# Patient Record
Sex: Female | Born: 1990 | Race: White | Hispanic: No | Marital: Married | Smoking: Never smoker
Health system: Southern US, Community
[De-identification: ages and names within clinical notes are randomized; demographics above are authoritative.]

## PROBLEM LIST (undated history)

## (undated) ENCOUNTER — Inpatient Hospital Stay: Payer: Self-pay

## (undated) DIAGNOSIS — Z789 Other specified health status: Secondary | ICD-10-CM

---

## 2012-12-05 ENCOUNTER — Emergency Department: Payer: Self-pay | Admitting: Emergency Medicine

## 2012-12-05 LAB — CBC
HGB: 12.7 g/dL (ref 12.0–16.0)
MCH: 31.7 pg (ref 26.0–34.0)
MCHC: 33.4 g/dL (ref 32.0–36.0)
Platelet: 271 10*3/uL (ref 150–440)
RBC: 4.02 10*6/uL (ref 3.80–5.20)
RDW: 12 % (ref 11.5–14.5)

## 2012-12-05 LAB — URINALYSIS, COMPLETE
Bacteria: NONE SEEN
Bilirubin,UR: NEGATIVE
Glucose,UR: NEGATIVE mg/dL (ref 0–75)
Leukocyte Esterase: NEGATIVE
RBC,UR: 1 /HPF (ref 0–5)
Squamous Epithelial: 17

## 2012-12-05 LAB — COMPREHENSIVE METABOLIC PANEL
Alkaline Phosphatase: 77 U/L — ABNORMAL LOW (ref 82–169)
BUN: 12 mg/dL (ref 7–18)
Creatinine: 0.82 mg/dL (ref 0.60–1.30)
EGFR (Non-African Amer.): >60
Glucose: 103 mg/dL — ABNORMAL HIGH (ref 65–99)
Osmolality: 279 (ref 275–301)
SGOT(AST): 49 U/L — ABNORMAL HIGH (ref 0–26)
Sodium: 140 mmol/L (ref 136–145)
Total Protein: 8.5 g/dL (ref 6.4–8.6)

## 2012-12-06 LAB — DIFFERENTIAL
Basophil #: 0 10*3/uL (ref 0.0–0.1)
Basophil %: 0.2 %
Eosinophil #: 0 10*3/uL (ref 0.0–0.7)
Eosinophil %: 0.1 %
Lymphocyte %: 4.2 %
Monocyte %: 2.8 %
Neutrophil #: 18.6 10*3/uL — ABNORMAL HIGH (ref 1.4–6.5)

## 2013-08-29 ENCOUNTER — Emergency Department: Payer: Self-pay | Admitting: Emergency Medicine

## 2014-11-10 IMAGING — US ABDOMEN ULTRASOUND LIMITED
1 series · 14 of 25 positions shown · non-contrast
Comparison: none

REASON FOR EXAM: epigastric pain
COMMENTS:   Body Site: GB and Fossa, CBD, Head of Pancreas

[Series 1: abdomen ultrasound limited · 0.20mm/px · 14 of 26 slices shown]
[im 1/26]
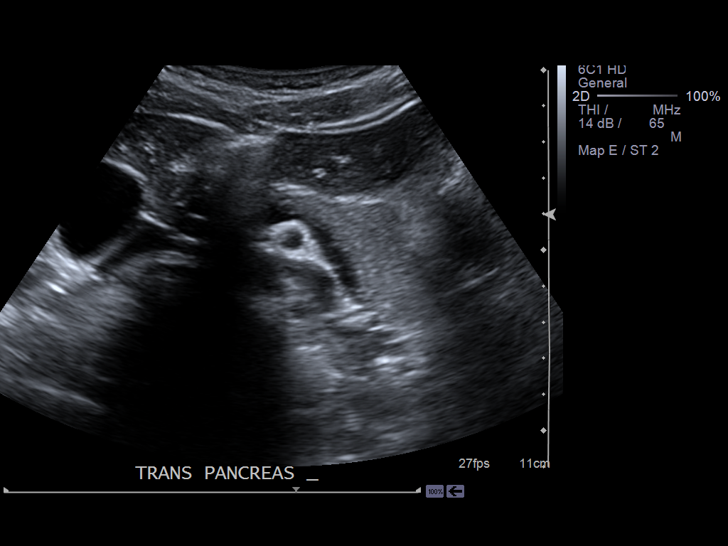
[im 3/26]
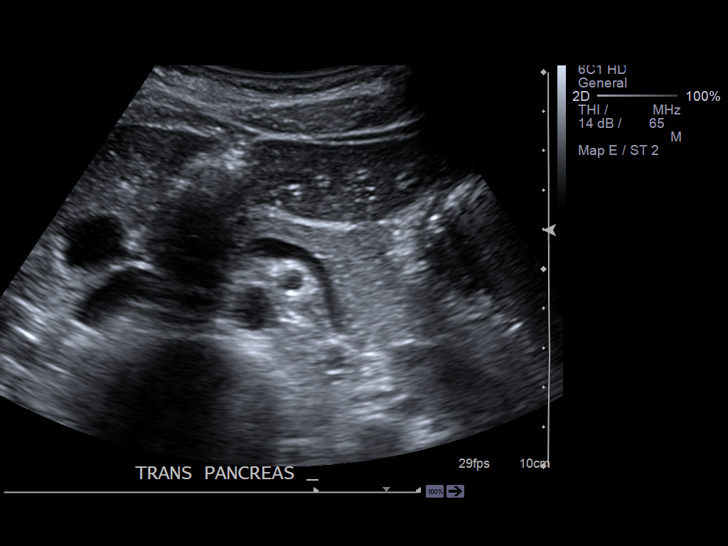
[im 5/26]
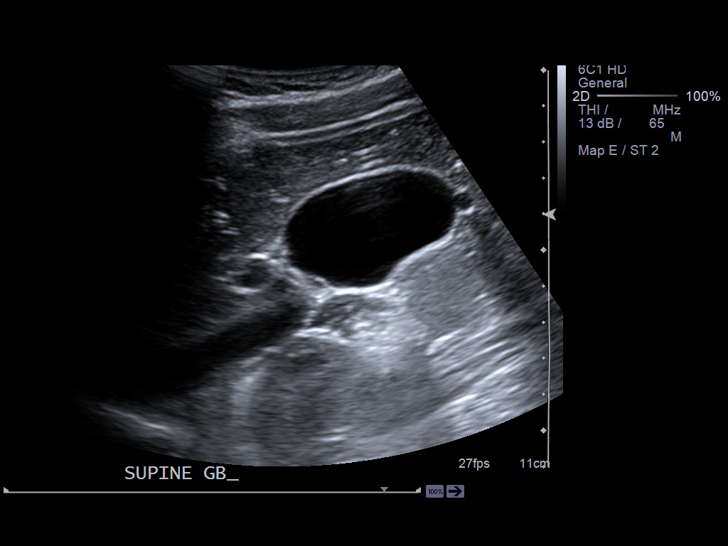
[im 7/26]
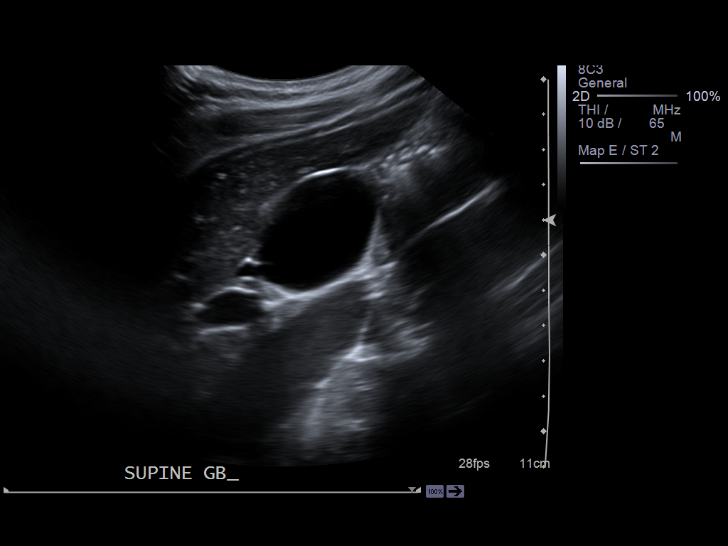
[im 9/26]
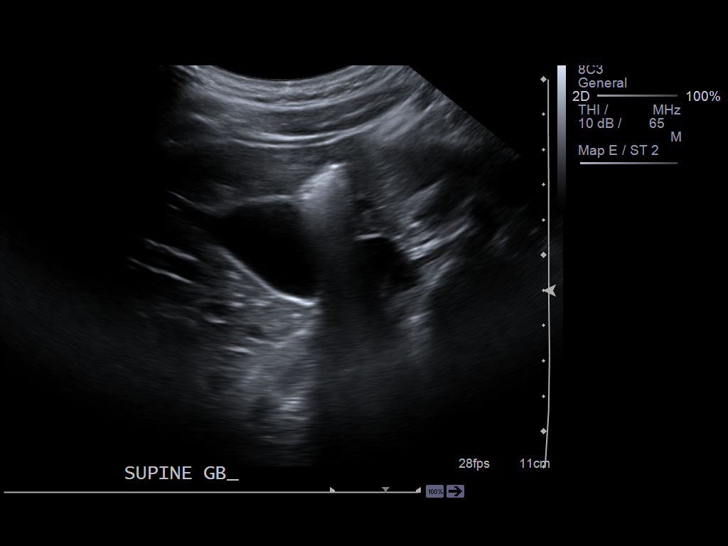
[im 10/26]
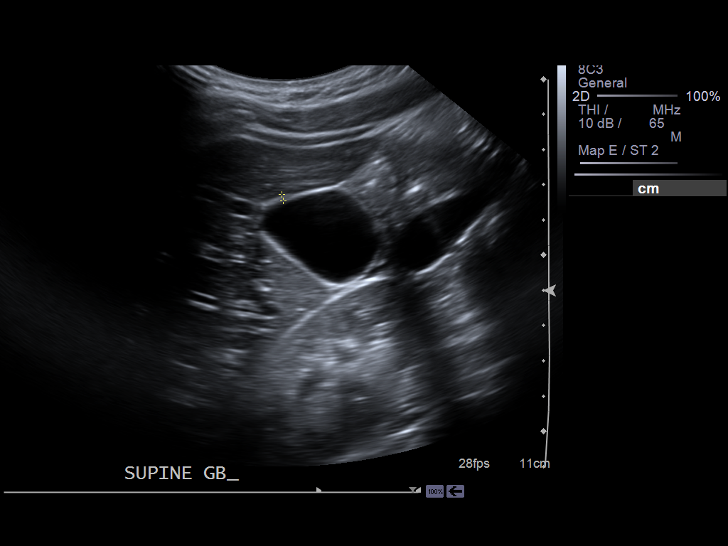
[im 12/26]
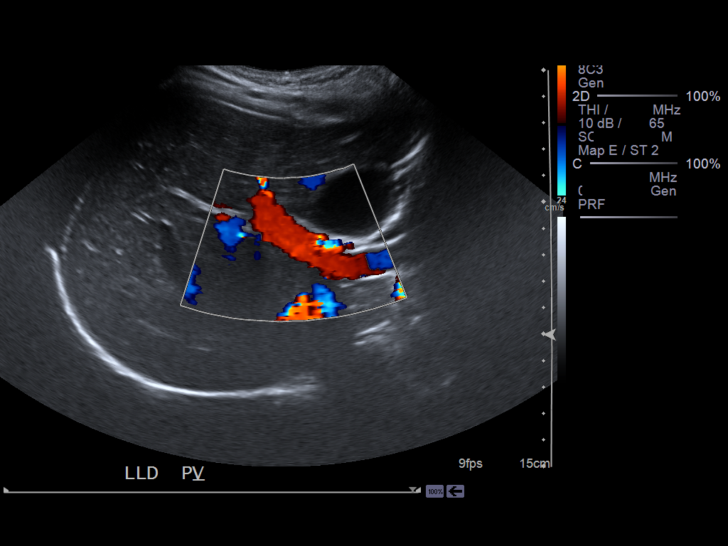
[im 14/26]
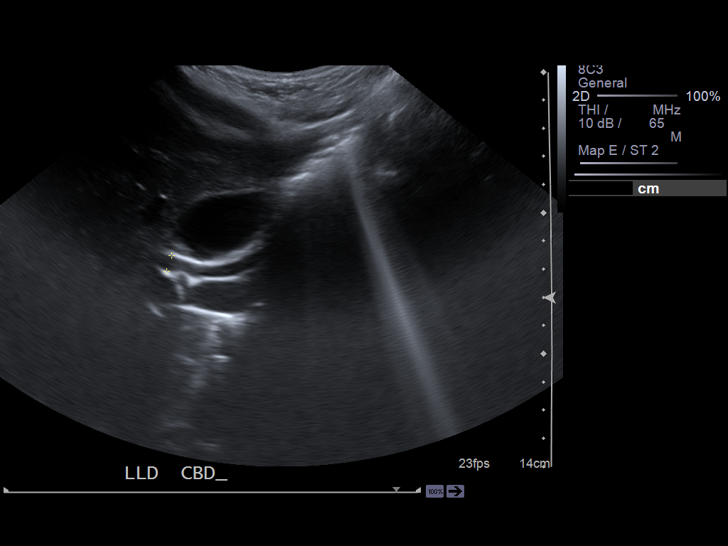
[im 16/26]
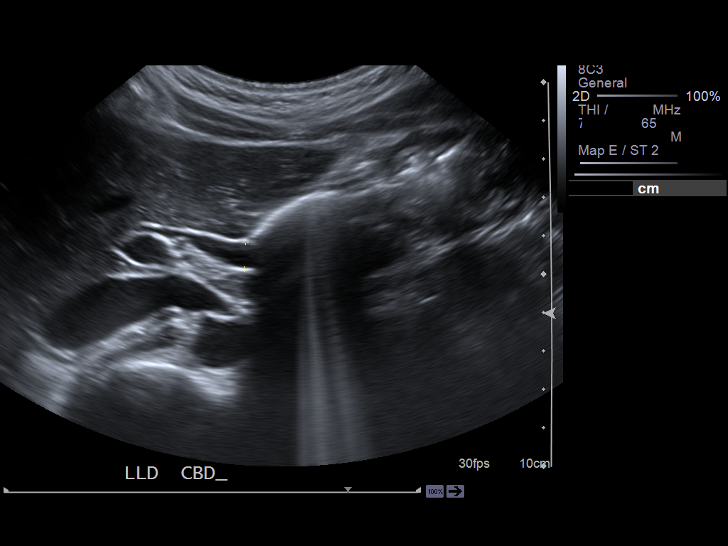
[im 17/26]
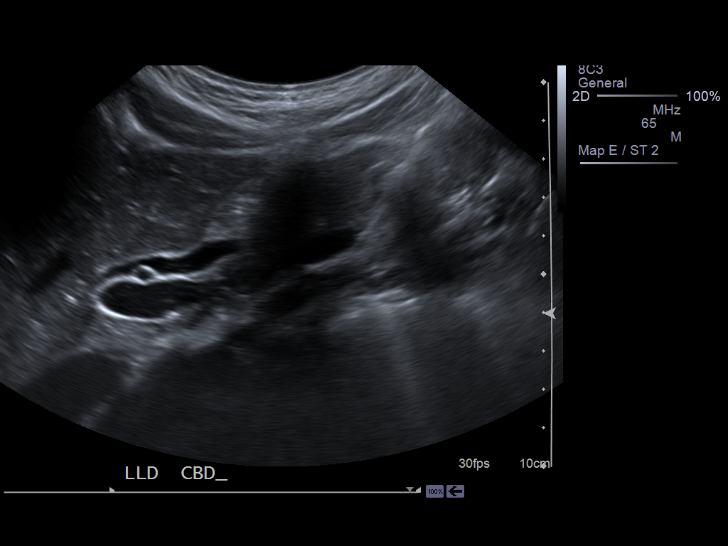
[im 19/26]
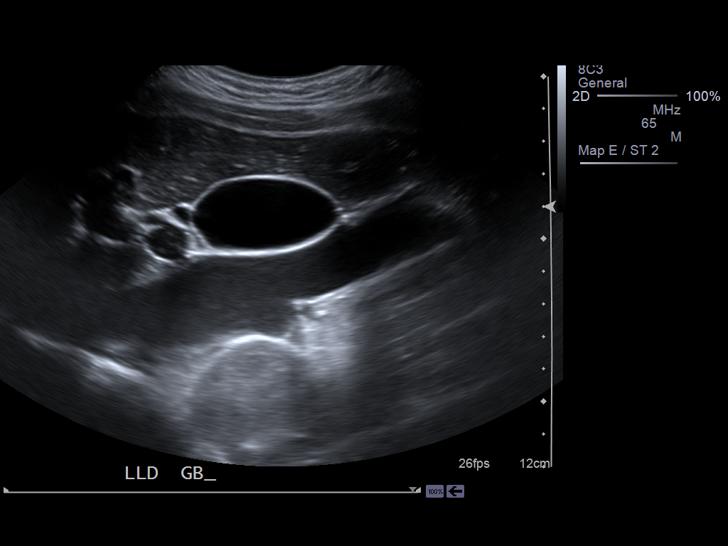
[im 21/26]
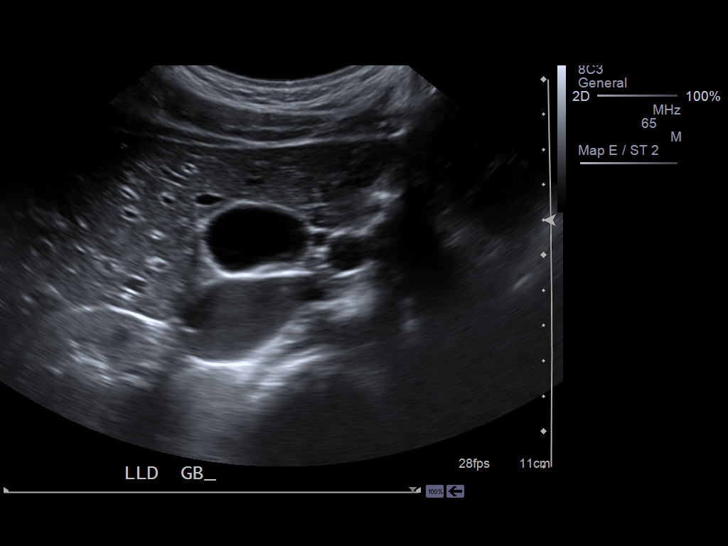
[im 23/26]
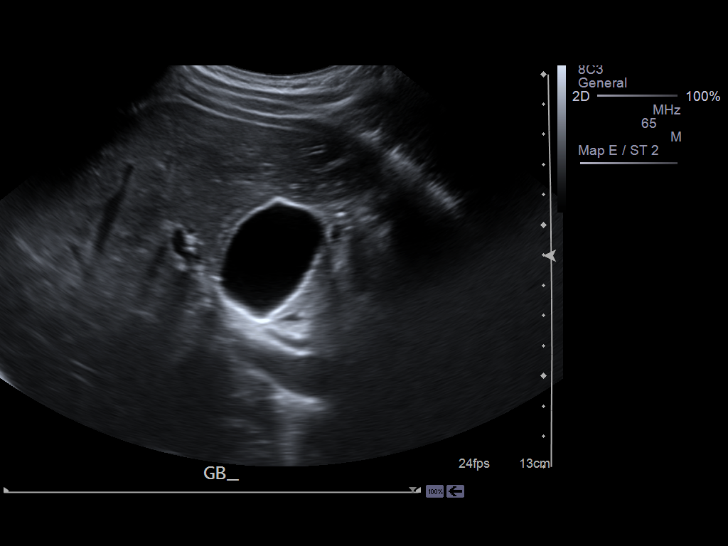
[im 26/26]
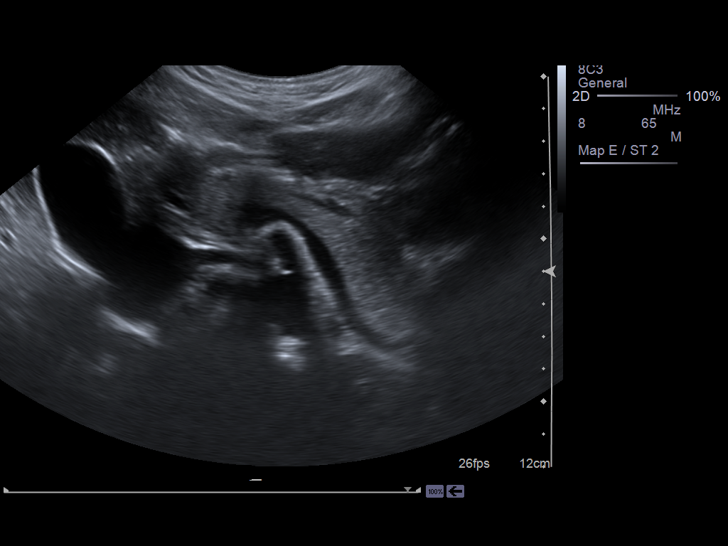

[14 of 25 positions shown; findings below may reference images not displayed]

PROCEDURE:     US  - US ABDOMEN LIMITED SURVEY  - December 06, 2012 [DATE]

RESULT:     Limited right upper quadrant abdominal sonogram is performed.
The visualized pancreatic body and head appear to be normal without ductal
dilation. No gallstones are demonstrated. The gallbladder wall thickness is
1.9 mm. The portal venous flow is normal. The common bile duct diameter is
abnormal measuring from 5.8 to 7.0 mm. No intrahepatic biliary ductal
dilation is evident. There is no sonographic Murphy's sign or
pericholecystic fluid.
IMPRESSION: 1. No evidence of cholelithiasis or cholecystitis. Dilation of the common
bile duct up to 7.0 mm. Correlate with LFTs.

[REDACTED]

## 2015-06-30 ENCOUNTER — Ambulatory Visit (INDEPENDENT_AMBULATORY_CARE_PROVIDER_SITE_OTHER): Payer: 59 | Admitting: Obstetrics and Gynecology

## 2015-06-30 VITALS — BP 136/82 | HR 91 | Ht 64.0 in | Wt 147.5 lb

## 2015-06-30 DIAGNOSIS — Z36 Encounter for antenatal screening of mother: Secondary | ICD-10-CM

## 2015-06-30 DIAGNOSIS — Z1389 Encounter for screening for other disorder: Secondary | ICD-10-CM

## 2015-06-30 DIAGNOSIS — Z331 Pregnant state, incidental: Secondary | ICD-10-CM

## 2015-06-30 DIAGNOSIS — Z369 Encounter for antenatal screening, unspecified: Secondary | ICD-10-CM

## 2015-06-30 DIAGNOSIS — Z113 Encounter for screening for infections with a predominantly sexual mode of transmission: Secondary | ICD-10-CM

## 2015-06-30 DIAGNOSIS — Z3687 Encounter for antenatal screening for uncertain dates: Secondary | ICD-10-CM

## 2015-06-30 LAB — OB RESULTS CONSOLE RUBELLA ANTIBODY, IGM: Rubella: NON-IMMUNE/NOT IMMUNE

## 2015-06-30 LAB — OB RESULTS CONSOLE HIV ANTIBODY (ROUTINE TESTING): HIV: NONREACTIVE

## 2015-06-30 LAB — OB RESULTS CONSOLE GC/CHLAMYDIA
CHLAMYDIA, DNA PROBE: NEGATIVE
Gonorrhea: NEGATIVE

## 2015-06-30 LAB — OB RESULTS CONSOLE VARICELLA ZOSTER ANTIBODY, IGG: Varicella: NON-IMMUNE/NOT IMMUNE

## 2015-06-30 LAB — OB RESULTS CONSOLE ABO/RH: RH TYPE: NEGATIVE

## 2015-06-30 NOTE — Progress Notes (Signed)
ZIKA EXPOSURE SCREEN:  The patient has not traveled to a [place] endemic area within the past 6 months, nor has she had unprotected sex with a partner who has travelled to a [place] endemic region within the past 6 months. The patient has been advised to notify us if these factors change any time during this current pregnancy, so adequate testing and monitoring can be initiated.  Angela Shelton for NOB nurse interview visit. G-1.   P-0. Pregnancy eduction material explained and given. NOB labs ordered. HIV consent form signed. PNV encouraged. NT declined, will do AFP in second trimester to discuss with provider.  Pt. Declined drug screen and CF testing. Pt. To follow up with provider in 3 weeks for NOB physical.  All questions answered. Unsure of LMP- dating US ordered. Confirmation done at [facility] on 05/29/2015.

## 2015-07-01 LAB — URINALYSIS, ROUTINE W REFLEX MICROSCOPIC
Bilirubin, UA: NEGATIVE
GLUCOSE, UA: NEGATIVE
Ketones, UA: NEGATIVE
Nitrite, UA: NEGATIVE
Protein, UA: NEGATIVE
RBC, UA: NEGATIVE
Specific Gravity, UA: 1.009 (ref 1.005–1.030)
UUROB: 0.2 mg/dL (ref 0.2–1.0)
pH, UA: 6.5 (ref 5.0–7.5)

## 2015-07-01 LAB — CBC WITH DIFFERENTIAL/PLATELET
Basophils Absolute: 0 10*3/uL (ref 0.0–0.2)
Basos: 0 %
EOS (ABSOLUTE): 0.2 10*3/uL (ref 0.0–0.4)
Eos: 1 %
Hematocrit: 34 % (ref 34.0–46.6)
Hemoglobin: 11.4 g/dL (ref 11.1–15.9)
IMMATURE GRANS (ABS): 0 10*3/uL (ref 0.0–0.1)
Immature Granulocytes: 0 %
LYMPHS: 12 %
Lymphocytes Absolute: 1.9 10*3/uL (ref 0.7–3.1)
MCH: 30.6 pg (ref 26.6–33.0)
MCHC: 33.5 g/dL (ref 31.5–35.7)
MCV: 91 fL (ref 79–97)
MONOS ABS: 0.8 10*3/uL (ref 0.1–0.9)
Monocytes: 5 %
NEUTROS PCT: 82 %
Neutrophils Absolute: 12.7 10*3/uL — ABNORMAL HIGH (ref 1.4–7.0)
Platelets: 302 10*3/uL (ref 150–379)
RBC: 3.72 x10E6/uL — AB (ref 3.77–5.28)
RDW: 12.8 % (ref 12.3–15.4)
WBC: 15.6 10*3/uL — ABNORMAL HIGH (ref 3.4–10.8)

## 2015-07-01 LAB — MICROSCOPIC EXAMINATION: Casts: NONE SEEN /lpf

## 2015-07-01 LAB — GC/CHLAMYDIA PROBE AMP
Chlamydia trachomatis, NAA: NEGATIVE
NEISSERIA GONORRHOEAE BY PCR: NEGATIVE

## 2015-07-01 LAB — HEPATITIS B SURFACE ANTIGEN: Hepatitis B Surface Ag: NEGATIVE

## 2015-07-01 LAB — RUBELLA ANTIBODY, IGM: Rubella IgM: <20 AU/mL (ref 0.0–19.9)

## 2015-07-01 LAB — RPR: RPR Ser Ql: NONREACTIVE

## 2015-07-01 LAB — HIV ANTIBODY (ROUTINE TESTING W REFLEX): HIV Screen 4th Generation wRfx: NONREACTIVE

## 2015-07-01 LAB — RH TYPE: Rh Factor: NEGATIVE

## 2015-07-01 LAB — URINE CULTURE: Organism ID, Bacteria: NO GROWTH

## 2015-07-01 LAB — ABO

## 2015-07-01 LAB — ANTIBODY SCREEN: Antibody Screen: NEGATIVE

## 2015-07-03 LAB — VARICELLA ZOSTER ANTIBODY, IGM: Varicella IgM: <0.91 index (ref 0.00–0.90)

## 2015-07-04 ENCOUNTER — Other Ambulatory Visit: Payer: Self-pay | Admitting: Obstetrics and Gynecology

## 2015-07-04 DIAGNOSIS — Z283 Underimmunization status: Secondary | ICD-10-CM

## 2015-07-04 DIAGNOSIS — O9989 Other specified diseases and conditions complicating pregnancy, childbirth and the puerperium: Secondary | ICD-10-CM

## 2015-07-04 DIAGNOSIS — O09899 Supervision of other high risk pregnancies, unspecified trimester: Secondary | ICD-10-CM

## 2015-07-05 ENCOUNTER — Ambulatory Visit: Payer: 59

## 2015-07-05 DIAGNOSIS — Z3687 Encounter for antenatal screening for uncertain dates: Secondary | ICD-10-CM

## 2015-07-05 DIAGNOSIS — Z36 Encounter for antenatal screening of mother: Secondary | ICD-10-CM | POA: Diagnosis not present

## 2015-07-07 ENCOUNTER — Telehealth: Payer: Self-pay | Admitting: Obstetrics and Gynecology

## 2015-07-07 NOTE — Telephone Encounter (Signed)
SHE IS GETTING MORNING SICKNESS THAT IS STARTING TO GET WORSE AND HAPPEN ALL THRU THE DAY, SHE WANTED TO KNOW IF WE COULD GIVE HER ANY SAMPLES

## 2015-07-10 NOTE — Telephone Encounter (Signed)
Samples of diclegis given to pt, with coupon card advised if medication worked for pt to call office We would send in to pharmacy

## 2015-07-21 ENCOUNTER — Ambulatory Visit (INDEPENDENT_AMBULATORY_CARE_PROVIDER_SITE_OTHER): Payer: 59 | Admitting: Obstetrics and Gynecology

## 2015-07-21 ENCOUNTER — Other Ambulatory Visit: Payer: Self-pay | Admitting: Obstetrics and Gynecology

## 2015-07-21 ENCOUNTER — Encounter: Payer: 59 | Admitting: Obstetrics and Gynecology

## 2015-07-21 ENCOUNTER — Encounter: Payer: Self-pay | Admitting: Obstetrics and Gynecology

## 2015-07-21 VITALS — BP 132/76 | HR 84 | Wt 144.9 lb

## 2015-07-21 DIAGNOSIS — O360121 Maternal care for anti-D [Rh] antibodies, second trimester, fetus 1: Secondary | ICD-10-CM

## 2015-07-21 DIAGNOSIS — O9989 Other specified diseases and conditions complicating pregnancy, childbirth and the puerperium: Secondary | ICD-10-CM

## 2015-07-21 DIAGNOSIS — O09899 Supervision of other high risk pregnancies, unspecified trimester: Secondary | ICD-10-CM

## 2015-07-21 DIAGNOSIS — Z3492 Encounter for supervision of normal pregnancy, unspecified, second trimester: Secondary | ICD-10-CM | POA: Diagnosis not present

## 2015-07-21 DIAGNOSIS — B3731 Acute candidiasis of vulva and vagina: Secondary | ICD-10-CM

## 2015-07-21 DIAGNOSIS — Z2839 Other underimmunization status: Secondary | ICD-10-CM | POA: Insufficient documentation

## 2015-07-21 DIAGNOSIS — O09893 Supervision of other high risk pregnancies, third trimester: Secondary | ICD-10-CM

## 2015-07-21 DIAGNOSIS — Z283 Underimmunization status: Secondary | ICD-10-CM | POA: Insufficient documentation

## 2015-07-21 DIAGNOSIS — O219 Vomiting of pregnancy, unspecified: Secondary | ICD-10-CM

## 2015-07-21 DIAGNOSIS — Z6791 Unspecified blood type, Rh negative: Secondary | ICD-10-CM | POA: Insufficient documentation

## 2015-07-21 DIAGNOSIS — O26899 Other specified pregnancy related conditions, unspecified trimester: Secondary | ICD-10-CM | POA: Insufficient documentation

## 2015-07-21 DIAGNOSIS — B373 Candidiasis of vulva and vagina: Secondary | ICD-10-CM

## 2015-07-21 LAB — POCT URINALYSIS DIPSTICK
Bilirubin, UA: NEGATIVE
Glucose, UA: NEGATIVE
KETONES UA: NEGATIVE
NITRITE UA: NEGATIVE
Spec Grav, UA: 1.01
Urobilinogen, UA: 0.2
pH, UA: 6.5

## 2015-07-21 MED ORDER — ONDANSETRON 4 MG PO TBDP: 4.0000 mg | ORAL_TABLET | Freq: Four times a day (QID) | ORAL | Status: DC | PRN

## 2015-07-21 MED ORDER — TERCONAZOLE 0.4 % VA CREA: 1.0000 | TOPICAL_CREAM | Freq: Every day | VAGINAL | Status: DC

## 2015-07-21 NOTE — Progress Notes (Signed)
Subjective:  Angela Shelton is a 24 y.o. G1P0 at [redacted]w[redacted]d being seen today for ongoing prenatal care.  Patient reports heartburn, nausea, vaginal irritation and vomiting.   .   .  . Denies leaking of fluid.   The following portions of the patient's history were reviewed and updated as appropriate: allergies, current medications, past family history, past medical history, past social history, past surgical history and problem list.   Objective:   Filed Vitals:   07/21/15 1110  BP: 132/76  Pulse: 84  Weight: 144 lb 14.4 oz (65.726 kg)    Fetal Status:           General:  Alert, oriented and cooperative. Patient is in no acute distress.  Skin: Skin is warm and dry. No rash noted.   Cardiovascular: Normal heart rate noted  Respiratory: Effort and breath sounds normal, no problems with respiration noted  Abdomen: Soft, gravid, appropriate for gestational age.       Vaginal:  .       Cervix: closed- pap obtained, thick yellow d/c c/w yeast noted  Extremities: Normal range of motion.     Mental Status: Normal mood and affect. Normal behavior. Normal judgment and thought content.   Urinalysis: Urine Protein: Trace Urine Glucose: Negative  Assessment and Plan:  Pregnancy: G1P0 at [redacted]w[redacted]d  1. Prenatal care, second trimester Genetic screening done - POCT urinalysis dipstick  2. Nausea and vomiting during pregnancy prior to [redacted] weeks gestation zofran  ODT rx sent in  3. Vaginal yeast infection Rx terazol sent in and instructed on use  4. Rubella non-immune status, antepartum Will give vaccine PP  5. Susceptible to Varicella (non-immune), currently pregnant in third trimester Will give vaccine PP  6. Rh negative state in antepartum period, second trimester, fetus 1 Counseled and rhogam will be given at 28 weeks and PP if indicated   Preterm labor symptoms and general obstetric precautions including but not limited to vaginal bleeding, contractions, leaking of fluid and fetal  movement were reviewed in detail with the patient.  Please refer to After Visit Summary for other counseling recommendations.   No Follow-up on file.   Kylin Dubs Elissa Lovett, CNM

## 2015-07-21 NOTE — Progress Notes (Signed)
Pt is c/o constipation, she is having lots of heartburn, burning sensation in her stomach, nausea and vomiting 3 x a day

## 2015-07-25 LAB — CYTOLOGY - PAP

## 2015-07-26 ENCOUNTER — Encounter: Payer: Self-pay | Admitting: *Deleted

## 2015-07-27 ENCOUNTER — Telehealth: Payer: Self-pay | Admitting: Obstetrics and Gynecology

## 2015-07-27 MED ORDER — ONDANSETRON 4 MG PO TBDP: 4.0000 mg | ORAL_TABLET | Freq: Four times a day (QID) | ORAL | Status: DC | PRN

## 2015-07-27 NOTE — Telephone Encounter (Signed)
Patient needs refill on zofran sent to [facility] [address] in [place].Thanks

## 2015-08-02 ENCOUNTER — Encounter: Payer: Self-pay | Admitting: Obstetrics and Gynecology

## 2015-08-08 ENCOUNTER — Encounter: Payer: Self-pay | Admitting: Obstetrics and Gynecology

## 2015-08-16 ENCOUNTER — Encounter: Payer: Self-pay | Admitting: Obstetrics and Gynecology

## 2015-08-16 ENCOUNTER — Ambulatory Visit (INDEPENDENT_AMBULATORY_CARE_PROVIDER_SITE_OTHER): Payer: 59 | Admitting: Obstetrics and Gynecology

## 2015-08-16 VITALS — BP 141/69 | HR 92 | Wt 150.1 lb

## 2015-08-16 DIAGNOSIS — Z331 Pregnant state, incidental: Secondary | ICD-10-CM

## 2015-08-16 LAB — POCT URINALYSIS DIPSTICK
Bilirubin, UA: NEGATIVE
Blood, UA: NEGATIVE
GLUCOSE UA: NEGATIVE
Ketones, UA: 5
Leukocytes, UA: NEGATIVE
Nitrite, UA: NEGATIVE
Protein, UA: NEGATIVE
SPEC GRAV UA: 1.01
Urobilinogen, UA: 0.2
pH, UA: 7

## 2015-08-16 NOTE — Progress Notes (Signed)
ROB- nausea daily, and worse if late taking zofran- feels like zofran isn't helping much either; to restart Diclegis 2 at bedtime, and take 1 zofran each am, then alternate with diclegis, also increased dose of zofran to  ODT; anatomy scan next visit. To add OTC magnesium for muscle cramps.

## 2015-08-16 NOTE — Progress Notes (Signed)
ROB-still c/o nausea, is taking the Zofran Eye is twitching, cramps in her LLE

## 2015-08-20 ENCOUNTER — Encounter: Payer: Self-pay | Admitting: Emergency Medicine

## 2015-08-20 ENCOUNTER — Other Ambulatory Visit: Payer: Self-pay

## 2015-08-20 ENCOUNTER — Emergency Department
Admission: EM | Admit: 2015-08-20 | Discharge: 2015-08-20 | Disposition: A | Discharge status: Home / Self Care | Payer: 59 | Attending: Emergency Medicine | Admitting: Emergency Medicine

## 2015-08-20 DIAGNOSIS — Z79899 Other long term (current) drug therapy: Secondary | ICD-10-CM | POA: Insufficient documentation

## 2015-08-20 DIAGNOSIS — Z880 Allergy status to penicillin: Secondary | ICD-10-CM | POA: Insufficient documentation

## 2015-08-20 DIAGNOSIS — Z3A18 18 weeks gestation of pregnancy: Secondary | ICD-10-CM | POA: Insufficient documentation

## 2015-08-20 DIAGNOSIS — R110 Nausea: Secondary | ICD-10-CM | POA: Diagnosis not present

## 2015-08-20 DIAGNOSIS — R55 Syncope and collapse: Secondary | ICD-10-CM | POA: Diagnosis not present

## 2015-08-20 DIAGNOSIS — O9989 Other specified diseases and conditions complicating pregnancy, childbirth and the puerperium: Secondary | ICD-10-CM | POA: Diagnosis not present

## 2015-08-20 LAB — BASIC METABOLIC PANEL
Anion gap: 9 (ref 5–15)
BUN: 9 mg/dL (ref 6–20)
CALCIUM: 9.2 mg/dL (ref 8.9–10.3)
CHLORIDE: 105 mmol/L (ref 101–111)
CO2: 22 mmol/L (ref 22–32)
CREATININE: 0.63 mg/dL (ref 0.44–1.00)
GFR calc non Af Amer: >60 mL/min (ref >60)
Glucose, Bld: 86 mg/dL (ref 65–99)
Potassium: 3.9 mmol/L (ref 3.5–5.1)
SODIUM: 136 mmol/L (ref 135–145)

## 2015-08-20 LAB — URINALYSIS COMPLETE WITH MICROSCOPIC (ARMC ONLY)
BILIRUBIN URINE: NEGATIVE
Glucose, UA: NEGATIVE mg/dL
Ketones, ur: NEGATIVE mg/dL
Leukocytes, UA: NEGATIVE
Nitrite: NEGATIVE
PH: 5 (ref 5.0–8.0)
PROTEIN: NEGATIVE mg/dL
Specific Gravity, Urine: 1.016 (ref 1.005–1.030)

## 2015-08-20 LAB — CBC
HCT: 33.4 % — ABNORMAL LOW (ref 35.0–47.0)
HEMOGLOBIN: 11.1 g/dL — AB (ref 12.0–16.0)
MCH: 30.7 pg (ref 26.0–34.0)
MCHC: 33.1 g/dL (ref 32.0–36.0)
MCV: 92.8 fL (ref 80.0–100.0)
PLATELETS: 258 10*3/uL (ref 150–440)
RBC: 3.6 MIL/uL — AB (ref 3.80–5.20)
RDW: 12.9 % (ref 11.5–14.5)
WBC: 19.9 10*3/uL — AB (ref 3.6–11.0)

## 2015-08-20 LAB — URINE DRUG SCREEN, QUALITATIVE (ARMC ONLY)
AMPHETAMINES, UR SCREEN: NOT DETECTED
BENZODIAZEPINE, UR SCRN: NOT DETECTED
Barbiturates, Ur Screen: NOT DETECTED
Cannabinoid 50 Ng, Ur ~~LOC~~: NOT DETECTED
Cocaine Metabolite,Ur ~~LOC~~: NOT DETECTED
MDMA (Ecstasy)Ur Screen: NOT DETECTED
METHADONE SCREEN, URINE: NOT DETECTED
Opiate, Ur Screen: NOT DETECTED
Phencyclidine (PCP) Ur S: NOT DETECTED
Tricyclic, Ur Screen: NOT DETECTED

## 2015-08-20 LAB — GLUCOSE, CAPILLARY: GLUCOSE-CAPILLARY: 78 mg/dL (ref 65–99)

## 2015-08-20 LAB — HCG, QUANTITATIVE, PREGNANCY: hCG, Beta Chain, Quant, S: 25773 m[IU]/mL — ABNORMAL HIGH (ref <5)

## 2015-08-20 NOTE — ED Notes (Signed)
CBG 78. 

## 2015-08-20 NOTE — ED Notes (Signed)
Pt states she was out shopping at approx 3pm and suddenly felt dizzy and everything was going black while she was standing in line at store. She sat all her items down and walked outside to sit down. No loc. States felt better after sitting, but since is pregnant wanted to be checked out. bs from labs 86

## 2015-08-20 NOTE — ED Notes (Signed)
Pt reports being 17 weeks 6 days with lightheaded and dizziness that started to day while shopping. Denies vomiting or diarrhea endorses nausea.

## 2015-08-20 NOTE — Discharge Instructions (Signed)
You were evaluated after an episode of dizziness and near passing out, and your exam and evaluation are reassuring. I suspect this occurred due to fluid changes during pregnancy. Make sure you drinking plenty of fluids, and eating frequent meals throughout the day. Return to the emergency department for any worsening condition including passing out, heart palpitations, chest pain, trouble breathing or shortness of breath, fever, weakness or numbness, or any other symptoms concerning to you.   Near-Syncope Near-syncope (commonly known as near fainting) is sudden weakness, dizziness, or feeling like you might pass out. During an episode of near-syncope, you may also develop pale skin, have tunnel vision, or feel sick to your stomach (nauseous). Near-syncope may occur when getting up after sitting or while standing for a long time. It is caused by a sudden decrease in blood flow to the brain. This decrease can result from various causes or triggers, most of which are not serious. However, because near-syncope can sometimes be a sign of something serious, a medical evaluation is required. The specific cause is often not determined. HOME CARE INSTRUCTIONS  Monitor your condition for any changes. The following actions may help to alleviate any discomfort you are experiencing:  Have someone stay with you until you feel stable.  Lie down right away and prop your feet up if you start feeling like you might faint. Breathe deeply and steadily. Wait until all the symptoms have passed. Most of these episodes last only a few minutes. You may feel tired for several hours.   Drink enough fluids to keep your urine clear or pale yellow.   If you are taking blood pressure or heart medicine, get up slowly when seated or lying down. Take several minutes to sit and then stand. This can reduce dizziness.  Follow up with your health care provider as directed. SEEK IMMEDIATE MEDICAL CARE IF:   You have a severe  headache.   You have unusual pain in the chest, abdomen, or back.   You are bleeding from the mouth or rectum, or you have black or tarry stool.   You have an irregular or very fast heartbeat.   You have repeated fainting or have seizure-like jerking during an episode.   You faint when sitting or lying down.   You have confusion.   You have difficulty walking.   You have severe weakness.   You have vision problems.  MAKE SURE YOU:   Understand these instructions.  Will watch your condition.  Will get help right away if you are not doing well or get worse. Document Released: 12/09/2005 Document Revised: 12/14/2013 Document Reviewed: 05/14/2013 [facility] Patient Information 2015 [place], [place]. This information is not intended to replace advice given to you by your health care provider. Make sure you discuss any questions you have with your health care provider.

## 2015-08-20 NOTE — ED Provider Notes (Signed)
[facility] Emergency Department Provider Note   ____________________________________________  Time seen: 6:30 PM I have reviewed the triage vital signs and the triage nursing note.  HISTORY  Chief Complaint Near Syncope   Historian Patient  HPI Angela Shelton is a 24 y.o. female who experienced a near syncopal episode. She was standing in line and started to feel lightheaded and dizzy and was sweating. She dropped her purchases and went outside and sat down. She did not actually pass out. No chest pain, trouble breathing, shortness breath, palpitations, or recent illness. She has had nausea throughout pregnancy. She takes Zofran and Diclegis for this. She was wondering if the new prescription Diclegis could've created the symptoms.  This is her first pregnancy. She has not felt the baby move yet.    History reviewed. No pertinent past medical history.  Patient Active Problem List   Diagnosis Date Noted  . Rubella non-immune status, antepartum 07/21/2015  . Susceptible to Varicella (non-immune), currently pregnant in third trimester 07/21/2015  . Rh negative state in antepartum period 07/21/2015    History reviewed. No pertinent past surgical history.  Current Outpatient Rx  Name  Route  Sig  Dispense  Refill  . Prenatal Vit-Fe Fumarate-FA (PRENATAL VITAMINS) 28-0.8 MG TABS   Oral   Take 1 tablet by mouth daily.         Marland Kitchen terconazole (TERAZOL 7) 0.4 % vaginal cream   Vaginal   Place 1 applicator vaginally at bedtime. Patient not taking: Reported on 08/16/2015   45 g   0     Allergies Penicillins  Family History  Problem Relation Age of Onset  . Diabetes Sister     Social History Social History  Substance Use Topics  . Smoking status: Never Smoker   . Smokeless tobacco: Never Used  . Alcohol Use: No    Review of Systems  Constitutional: Negative for fever. Eyes: Negative for visual changes. ENT: Negative for sore  throat. Cardiovascular: Negative for chest pain. Respiratory: Negative for shortness of breath. Gastrointestinal: Negative for abdominal pain, vomiting and diarrhea. Genitourinary: Negative for dysuria. Musculoskeletal: Negative for back pain. Skin: Negative for rash. Neurological: Negative for headache. 10 point Review of Systems otherwise negative ____________________________________________   PHYSICAL EXAM:  VITAL SIGNS: ED Triage Vitals  Enc Vitals Group     BP 08/20/15 1542 131/68 mmHg     Pulse Rate 08/20/15 1542 92     Resp 08/20/15 1542 18     Temp 08/20/15 1542 98.2 F (36.8 C)     Temp Source 08/20/15 1542 Oral     SpO2 08/20/15 1542 99 %     Weight 08/20/15 1542 145 lb (65.772 kg)     Height 08/20/15 1542 5\' 4"  (1.626 m)     Head Cir --      Peak Flow --      Pain Score --      Pain Loc --      Pain Edu? --      Excl. in GC? --      Constitutional: Alert and oriented. Well appearing and in no distress. Eyes: Conjunctivae are normal. PERRL. Normal extraocular movements. ENT   Head: Normocephalic and atraumatic.   Nose: No congestion/rhinnorhea.   Mouth/Throat: Mucous membranes are moist.   Neck: No stridor. Cardiovascular/Chest: Normal rate, regular rhythm.  No murmurs, rubs, or gallops. Respiratory: Normal respiratory effort without tachypnea nor retractions. Breath sounds are clear and equal bilaterally. No wheezes/rales/rhonchi. Gastrointestinal: Soft.  No distention, no guarding, no rebound. Nontender.  Genitourinary/rectal:Deferred Musculoskeletal: Nontender with normal range of motion in all extremities. No joint effusions.  No lower extremity tenderness.  No edema. Neurologic:  Normal speech and language. No gross or focal neurologic deficits are appreciated. Skin:  Skin is warm, dry and intact. No rash noted. Psychiatric: Mood and affect are normal. Speech and behavior are normal. Patient exhibits appropriate insight and  judgment.  ____________________________________________   EKG I, Governor Rooks, MD, the attending physician have personally viewed and interpreted all ECGs.  91 bpm. Normal sinus rhythm. Narrow QRS. Normal axis. Normal ST and T-wave. QTc 467. No evidence of Brugada or Wolff-Parkinson-White. ____________________________________________  LABS (pertinent positives/negatives)  Urinalysis negative for ketones, leukocytes, red blood cells, white blood cells, with rare bacteria Basic metabolic panel without significant abnormalities Beta HCG 25,733 White blood count 19.9, hemoglobin 11.1, platelet count 258  ____________________________________________  RADIOLOGY All Xrays were viewed by me. Imaging interpreted by Radiologist.  None __________________________________________  PROCEDURES  Procedure(s) performed: None  Critical Care performed: None  ____________________________________________   ED COURSE / ASSESSMENT AND PLAN  CONSULTATIONS: None  Pertinent labs & imaging results that were available during my care of the patient were reviewed by me and considered in my medical decision making (see chart for details).   Near syncopal episode by history with a normal physical exam here and asymptomatic now. I suspect that the near-syncopal episode was likely due to the volume shift during pregnancy.   Evaluation and exam are reassuring. White blood count is 19,000, of undetermined etiology. Of note 2 prior blood work history showed also elevated white blood count. I've asked the patient to follow-up this week with her OB/GYN or primary care physician in about 2 days for reevaluation.  Return precautions were given with respect to any symptoms of infection or any additional near-syncope or syncope.   Patient / Family / Caregiver informed of clinical course, medical decision-making process, and agree with plan.   I discussed return precautions, follow-up instructions, and  discharged instructions with patient and/or family.  ___________________________________________   FINAL CLINICAL IMPRESSION(S) / ED DIAGNOSES   Final diagnoses:  Near syncope       Governor Rooks, MD 08/20/15 2007

## 2015-08-20 NOTE — ED Notes (Signed)
Received call from lab stating at that CBC needs to be re-drawn.

## 2015-08-22 ENCOUNTER — Encounter: Payer: Self-pay | Admitting: Obstetrics and Gynecology

## 2015-08-22 ENCOUNTER — Ambulatory Visit (INDEPENDENT_AMBULATORY_CARE_PROVIDER_SITE_OTHER): Payer: 59 | Admitting: Obstetrics and Gynecology

## 2015-08-22 ENCOUNTER — Other Ambulatory Visit: Payer: Self-pay | Admitting: Obstetrics and Gynecology

## 2015-08-22 VITALS — BP 128/74 | HR 81 | Wt 150.4 lb

## 2015-08-22 DIAGNOSIS — Z331 Pregnant state, incidental: Secondary | ICD-10-CM

## 2015-08-22 DIAGNOSIS — D72829 Elevated white blood cell count, unspecified: Secondary | ICD-10-CM

## 2015-08-22 LAB — POCT URINALYSIS DIPSTICK
GLUCOSE UA: NEGATIVE
Ketones, UA: NEGATIVE
LEUKOCYTES UA: NEGATIVE
NITRITE UA: NEGATIVE
Spec Grav, UA: 1.02
UROBILINOGEN UA: 0.2
pH, UA: 5

## 2015-08-22 LAB — URINE CULTURE

## 2015-08-22 NOTE — Progress Notes (Signed)
ROB- went to ER 08/20/2015 was shopping and felt really dizzy States she is having a "pounding headache" today, some blurred vision

## 2015-08-22 NOTE — Progress Notes (Signed)
See ED notes- feels better but still has pounding pressure in head and dizziness, really tired. Only abnormal lab was elevated WBC- repeated lab with [facility] panel added. Denies fever or signs of infection. To rest, push fluids and will follow-up accordingly.

## 2015-08-23 ENCOUNTER — Telehealth: Payer: Self-pay | Admitting: *Deleted

## 2015-08-23 LAB — ANTIBODY TITER (PRENATAL TITER)
CMV Ab - IgG: <0.6 U/mL (ref 0.00–0.59)
HSV 1 Glycoprotein G Ab, IgG: <0.91 index (ref 0.00–0.90)
HSV 2 Glycoprotein G Ab, IgG: <0.91 index (ref 0.00–0.90)
RUBELLA: 3.24 {index} (ref >0.99)
Toxoplasma IgG Ratio: <3 IU/mL (ref 0.0–7.1)

## 2015-08-23 LAB — RUBELLA SCREEN: RUBELLA: 2.7 {index} (ref >0.99)

## 2015-08-23 LAB — HSV 2 ANTIBODY, IGG

## 2015-08-23 LAB — CBC
HEMATOCRIT: 33.8 % — AB (ref 34.0–46.6)
HEMOGLOBIN: 11.2 g/dL (ref 11.1–15.9)
MCH: 31 pg (ref 26.6–33.0)
MCHC: 33.1 g/dL (ref 31.5–35.7)
MCV: 94 fL (ref 79–97)
Platelets: 287 10*3/uL (ref 150–379)
RBC: 3.61 x10E6/uL — AB (ref 3.77–5.28)
RDW: 13.3 % (ref 12.3–15.4)
WBC: 15.2 10*3/uL — ABNORMAL HIGH (ref 3.4–10.8)

## 2015-08-23 LAB — RPR: RPR: NONREACTIVE

## 2015-08-23 LAB — TOXOPLASMA GONDII ANTIBODY, IGG: Toxoplasma IgG Ratio: <3 IU/mL (ref 0.0–7.1)

## 2015-08-23 LAB — HSV 1 ANTIBODY, IGG

## 2015-08-23 LAB — CMV ANTIBODY, IGG (EIA)

## 2015-08-23 NOTE — Telephone Encounter (Signed)
Notified pt of lab results, she states she is still having some headaches and dizzy spells,advised pt I would make you aware

## 2015-08-23 NOTE — Telephone Encounter (Signed)
-----   Message from Melody N Burr, CNM sent at 08/23/2015  2:07 PM EDT ----- Please let her know TORCH panel was negative, and WBC is coming down. Probably viral. To let us know if she doesn't continue to feel better. 

## 2015-08-29 ENCOUNTER — Telehealth: Payer: Self-pay | Admitting: *Deleted

## 2015-08-29 NOTE — Telephone Encounter (Signed)
-----   Message from Ulyses Amor, [place] sent at 08/23/2015  2:07 PM EDT ----- Please let her know TORCH panel was negative, and WBC is coming down. Probably viral. To let us know if she doesn't continue to feel better.

## 2015-08-29 NOTE — Telephone Encounter (Signed)
Notified pt message was sent to [facility] about her still not feeling better

## 2015-09-13 ENCOUNTER — Ambulatory Visit: Payer: 59

## 2015-09-13 ENCOUNTER — Encounter: Payer: 59 | Admitting: Obstetrics and Gynecology

## 2015-09-13 ENCOUNTER — Ambulatory Visit (INDEPENDENT_AMBULATORY_CARE_PROVIDER_SITE_OTHER): Payer: 59 | Admitting: Obstetrics and Gynecology

## 2015-09-13 VITALS — BP 118/74 | HR 100 | Wt 158.4 lb

## 2015-09-13 DIAGNOSIS — Z3492 Encounter for supervision of normal pregnancy, unspecified, second trimester: Secondary | ICD-10-CM

## 2015-09-13 DIAGNOSIS — Z331 Pregnant state, incidental: Secondary | ICD-10-CM

## 2015-09-13 DIAGNOSIS — Z23 Encounter for immunization: Secondary | ICD-10-CM

## 2015-09-13 LAB — POCT URINALYSIS DIPSTICK
BILIRUBIN UA: NEGATIVE
GLUCOSE UA: NEGATIVE
KETONES UA: NEGATIVE
LEUKOCYTES UA: NEGATIVE
NITRITE UA: NEGATIVE
Protein, UA: NEGATIVE
Spec Grav, UA: 1.02
Urobilinogen, UA: NEGATIVE
pH, UA: 6

## 2015-09-13 MED ORDER — INFLUENZA VAC SPLIT QUAD 0.5 ML IM SUSY
0.5000 mL | PREFILLED_SYRINGE | Freq: Once | INTRAMUSCULAR | Status: AC
  Administered 2015-09-13: 0.5 mL via INTRAMUSCULAR

## 2015-09-13 NOTE — Patient Instructions (Signed)

## 2015-09-13 NOTE — Progress Notes (Signed)
Indications:  Anatomy Findings:  Singleton intrauterine pregnancy is visualized with FHR at 141 BPM. Biometrics give an (U/S) Gestational age of [redacted] weeks and 5 days, and an (U/S) EDD of 01/26/2016; this correlates with the clinically established EDD of 01/29/2016.  Fetal presentation is breech, spine posterior.  EFW: 386 grams ( 0 lbs. 14 oz. ).  Placenta: Posterior, grade 0, remote to cervix by greater than 5 cm.  AFI: adequate with MVP 3.5 cm.   Anatomic survey is incomplete due to fetal position (breech, spine posterior). Need spine views in long and transverse, otherwise, all other anatomy appears normal; Gender - female  .   Right and left ovaries not visualized.  Survey of the adnexa demonstrates no adnexal masses. There is no free peritoneal fluid in the cul de sac.  Impression: 1. 20 week 5 day Viable Singleton Intrauterine pregnancy by U/S. 2. (U/S) EDD is consistent with Clinically established (LMP) EDD of 01/29/2016. 3. Need follow up anatomy for acquisition of spine views due to fetal position.  3. Normal Anatomy Scan  Reviewed scan, will schedule f/u in 3 weeks. Flu vaccine given

## 2015-10-04 ENCOUNTER — Ambulatory Visit: Payer: 59

## 2015-10-04 ENCOUNTER — Ambulatory Visit (INDEPENDENT_AMBULATORY_CARE_PROVIDER_SITE_OTHER): Payer: 59 | Admitting: Obstetrics and Gynecology

## 2015-10-04 ENCOUNTER — Encounter: Payer: Self-pay | Admitting: Obstetrics and Gynecology

## 2015-10-04 VITALS — BP 116/82 | HR 101 | Wt 162.1 lb

## 2015-10-04 DIAGNOSIS — Z331 Pregnant state, incidental: Secondary | ICD-10-CM

## 2015-10-04 DIAGNOSIS — Z3492 Encounter for supervision of normal pregnancy, unspecified, second trimester: Secondary | ICD-10-CM

## 2015-10-04 LAB — POCT URINALYSIS DIPSTICK
KETONES UA: 15
LEUKOCYTES UA: NEGATIVE
Nitrite, UA: NEGATIVE
PROTEIN UA: NEGATIVE
Spec Grav, UA: 1.02
Urobilinogen, UA: 0.2
pH, UA: 6

## 2015-10-04 NOTE — Progress Notes (Signed)
ROB-pt states headaches are better, she is having some cramping B lower abdomen

## 2015-10-04 NOTE — Progress Notes (Signed)
Indications:Incomplete Anatomy Scan Findings:  Mason JimSingleton intrauterine pregnancy is visualized with FHR at 141 BPM. Biometrics give an (U/S) Gestational age of 22 1/7 weeks and an (U/S) EDD of 01/23/2016; this correlates with the clinically established EDD of 01/29/2016.  Fetal presentation is Vertex.  EFW: 648 g, 1 lb 7 oz, 51%. Placenta: posterior and remote from the cervix. AFI: appears adequate.  Anatomic survey is now complete and appears normal; Gender - female  .    Survey of the adnexa demonstrates no adnexal masses. There is no free peritoneal fluid in the cul de sac.  Impression: 1. 24 1/7 week Viable Singleton Intrauterine pregnancy by U/S. 2. (U/S) EDD is consistent with Clinically established (LMP) EDD of 01/29/2016. 3. Normal follow up anatomy Scan  Recommendations: 1.Clinical correlation with the patient's History and Physical Exam.   Lenoria FarrierLewis,Amber, Rad Tech  Scan reviewed and agree with findings  Yolanda BonineMelody Burr, CNM

## 2015-10-04 NOTE — Patient Instructions (Signed)

## 2015-10-30 ENCOUNTER — Telehealth: Payer: Self-pay | Admitting: Obstetrics and Gynecology

## 2015-10-30 NOTE — Telephone Encounter (Signed)
Pt calls and states she is cramping like a bad period and left side hurts. Having a little nausea, no vomiting. Would like to be checked and make sure everything is all right.

## 2015-10-30 NOTE — Telephone Encounter (Signed)
Pt aware that Dr. Valentino Saxonherry must be tied up in OR because she has not called yet (called at 3:26pm) and she has 2 options: go to ER if pain worsens, vaginal bleeding, leaking fluid or be seen in the office in the am. Pt opted office in the am. appt at 8:30am

## 2015-10-30 NOTE — Telephone Encounter (Signed)
Pt called and she is having a discharge and she wanted to know if its something she should be concerned about.

## 2015-10-30 NOTE — Telephone Encounter (Signed)
Pt called with vaginal discharge, jelly like substance, lots of pressure, cramps-period like. No vaginal bleeding or leakage of fluid. Pt will leave work and go home and lie on left side x1hr, drink plenty of water, eat, and call office if no better.

## 2015-10-31 ENCOUNTER — Encounter: Payer: Self-pay | Admitting: Obstetrics and Gynecology

## 2015-10-31 ENCOUNTER — Ambulatory Visit (INDEPENDENT_AMBULATORY_CARE_PROVIDER_SITE_OTHER): Payer: 59 | Admitting: Obstetrics and Gynecology

## 2015-10-31 VITALS — BP 135/76 | HR 88 | Wt 165.7 lb

## 2015-10-31 DIAGNOSIS — O4702 False labor before 37 completed weeks of gestation, second trimester: Secondary | ICD-10-CM

## 2015-10-31 DIAGNOSIS — Z3493 Encounter for supervision of normal pregnancy, unspecified, third trimester: Secondary | ICD-10-CM

## 2015-10-31 LAB — POCT URINALYSIS DIPSTICK
BILIRUBIN UA: NEGATIVE
Blood, UA: NEGATIVE
GLUCOSE UA: NEGATIVE
KETONES UA: NEGATIVE
Nitrite, UA: NEGATIVE
SPEC GRAV UA: 1.01
Urobilinogen, UA: 0.2
pH, UA: 7

## 2015-10-31 LAB — FETAL FIBRONECTIN: Fetal Fibronectin: NEGATIVE

## 2015-10-31 MED ORDER — BETAMETHASONE SOD PHOS & ACET 6 (3-3) MG/ML IJ SUSP
12.0000 mg | Freq: Once | INTRAMUSCULAR | Status: AC
  Administered 2015-10-31: 12 mg via INTRAMUSCULAR

## 2015-10-31 NOTE — Progress Notes (Signed)
Problem OB- reports waking up this am with pelvic pain and cramping, feels nauseated; denies bleeding, just increased mucus, FFN obtained. Reactive NST with >6 mild/short U/C's noted- FFN negative- pelvic rest and note to excuse from work, 1st dose betamethasone given- PTL precautions discussed; to RTC tomorrow for 2nd dose medications and recheck

## 2015-10-31 NOTE — Patient Instructions (Signed)

## 2015-10-31 NOTE — Telephone Encounter (Signed)
Pt came in 10/31/15

## 2015-10-31 NOTE — Progress Notes (Signed)
OB work in-c/o lower abd pain started yesterday, she is c/o vaginal d/c

## 2015-11-01 ENCOUNTER — Telehealth: Payer: Self-pay | Admitting: *Deleted

## 2015-11-01 ENCOUNTER — Encounter: Payer: Self-pay | Admitting: Obstetrics and Gynecology

## 2015-11-01 ENCOUNTER — Ambulatory Visit (INDEPENDENT_AMBULATORY_CARE_PROVIDER_SITE_OTHER): Payer: 59 | Admitting: Obstetrics and Gynecology

## 2015-11-01 VITALS — BP 129/76 | HR 92 | Wt 160.0 lb

## 2015-11-01 DIAGNOSIS — Z3493 Encounter for supervision of normal pregnancy, unspecified, third trimester: Secondary | ICD-10-CM

## 2015-11-01 LAB — POCT URINALYSIS DIPSTICK
Bilirubin, UA: NEGATIVE
Glucose, UA: NEGATIVE
KETONES UA: 15
Leukocytes, UA: NEGATIVE
Nitrite, UA: NEGATIVE
PH UA: 6.5
SPEC GRAV UA: 1.015
Urobilinogen, UA: 0.2

## 2015-11-01 MED ORDER — BETAMETHASONE SOD PHOS & ACET 6 (3-3) MG/ML IJ SUSP
12.0000 mg | Freq: Once | INTRAMUSCULAR | Status: AC
  Administered 2015-11-01: 12 mg via INTRAMUSCULAR

## 2015-11-01 NOTE — Progress Notes (Signed)
ROB-pt is still having low back pain

## 2015-11-01 NOTE — Telephone Encounter (Signed)
Notified pt of results 

## 2015-11-01 NOTE — Telephone Encounter (Signed)
-----   Message from Ulyses AmorMelody N Burr, [place] sent at 10/31/2015  3:24 PM EST ----- Please let her know it was negative

## 2015-11-02 ENCOUNTER — Other Ambulatory Visit: Payer: 59

## 2015-11-02 DIAGNOSIS — Z131 Encounter for screening for diabetes mellitus: Secondary | ICD-10-CM

## 2015-11-02 DIAGNOSIS — Z3493 Encounter for supervision of normal pregnancy, unspecified, third trimester: Secondary | ICD-10-CM

## 2015-11-03 ENCOUNTER — Encounter: Payer: Self-pay | Admitting: Obstetrics and Gynecology

## 2015-11-03 ENCOUNTER — Telehealth: Payer: Self-pay | Admitting: *Deleted

## 2015-11-03 ENCOUNTER — Ambulatory Visit (INDEPENDENT_AMBULATORY_CARE_PROVIDER_SITE_OTHER): Payer: 59 | Admitting: Obstetrics and Gynecology

## 2015-11-03 ENCOUNTER — Other Ambulatory Visit: Payer: Self-pay | Admitting: Obstetrics and Gynecology

## 2015-11-03 ENCOUNTER — Encounter: Payer: 59 | Admitting: Obstetrics and Gynecology

## 2015-11-03 VITALS — BP 124/70 | HR 83 | Wt 166.2 lb

## 2015-11-03 DIAGNOSIS — Z418 Encounter for other procedures for purposes other than remedying health state: Secondary | ICD-10-CM

## 2015-11-03 DIAGNOSIS — Z2913 Encounter for prophylactic Rho(D) immune globulin: Secondary | ICD-10-CM

## 2015-11-03 DIAGNOSIS — Z23 Encounter for immunization: Secondary | ICD-10-CM | POA: Diagnosis not present

## 2015-11-03 DIAGNOSIS — O99019 Anemia complicating pregnancy, unspecified trimester: Secondary | ICD-10-CM | POA: Insufficient documentation

## 2015-11-03 DIAGNOSIS — Z3493 Encounter for supervision of normal pregnancy, unspecified, third trimester: Secondary | ICD-10-CM

## 2015-11-03 LAB — POCT URINALYSIS DIPSTICK
BILIRUBIN UA: NEGATIVE
Glucose, UA: NEGATIVE
KETONES UA: NEGATIVE
Leukocytes, UA: NEGATIVE
NITRITE UA: NEGATIVE
PH UA: 6.5
Spec Grav, UA: 1.015
Urobilinogen, UA: 0.2

## 2015-11-03 LAB — GLUCOSE, 1 HOUR GESTATIONAL: Gestational Diabetes Screen: 120 mg/dL (ref 65–139)

## 2015-11-03 LAB — HEMOGLOBIN AND HEMATOCRIT, BLOOD
HEMATOCRIT: 31.2 % — AB (ref 34.0–46.6)
HEMOGLOBIN: 10.5 g/dL — AB (ref 11.1–15.9)

## 2015-11-03 MED ORDER — RANITIDINE HCL 150 MG PO TABS: 150.0000 mg | ORAL_TABLET | Freq: Two times a day (BID) | ORAL | Status: DC

## 2015-11-03 MED ORDER — TETANUS-DIPHTH-ACELL PERTUSSIS 5-2.5-18.5 LF-MCG/0.5 IM SUSP
0.5000 mL | Freq: Once | INTRAMUSCULAR | Status: AC
  Administered 2015-11-03: 0.5 mL via INTRAMUSCULAR

## 2015-11-03 MED ORDER — FUSION PLUS PO CAPS: 1.0000 | ORAL_CAPSULE | Freq: Every day | ORAL | Status: DC

## 2015-11-03 MED ORDER — RHO D IMMUNE GLOBULIN 1500 UNITS IM SOSY
1500.0000 [IU] | PREFILLED_SYRINGE | Freq: Once | INTRAMUSCULAR | Status: AC
  Administered 2015-11-03: 1500 [IU] via INTRAMUSCULAR

## 2015-11-03 NOTE — Telephone Encounter (Signed)
Pt was seen 11/03/15 was given info

## 2015-11-03 NOTE — Telephone Encounter (Signed)
-----   Message from Ulyses AmorMelody N Burr, [place] sent at 11/03/2015 11:37 AM EST ----- Please let her know she passed her glucola, is slightly anemic- I will send in a rx for daily iron supplement to take in addition to PNV, and recheck levels at 36 weeks, and to increase daily intake of iron rich foods.

## 2015-11-03 NOTE — Progress Notes (Signed)
ROB-pt states as long as she is lying down she feels fine When she gets up to move around, pelvic pressure, contractions start

## 2015-11-03 NOTE — Patient Instructions (Signed)
Rh0 [D] Immune Globulin injection What is this medicine? RhO [D] IMMUNE GLOBULIN (i MYOON GLOB yoo lin) is used to treat idiopathic thrombocytopenic purpura (ITP). This medicine is used in RhO negative mothers who are pregnant with a RhO positive child. It is also used after a transfusion of RhO positive blood into a RhO negative person. This medicine may be used for other purposes; ask your health care provider or pharmacist if you have questions. What should I tell my health care provider before I take this medicine? They need to know if you have any of these conditions: -bleeding disorders -low levels of immunoglobulin A in the body -no spleen -an unusual or allergic reaction to human immune globulin, other medicines, foods, dyes, or preservatives -pregnant or trying to get pregnant -breast-feeding How should I use this medicine? This medicine is for injection into a muscle or into a vein. It is given by a health care professional in a hospital or clinic setting. Talk to your pediatrician regarding the use of this medicine in children. This medicine is not approved for use in children. Overdosage: If you think you have taken too much of this medicine contact a poison control center or emergency room at once. NOTE: This medicine is only for you. Do not share this medicine with others. What if I miss a dose? It is important not to miss your dose. Call your doctor or health care professional if you are unable to keep an appointment. What may interact with this medicine? -live virus vaccines, like measles, mumps, or rubella This list may not describe all possible interactions. Give your health care provider a list of all the medicines, herbs, non-prescription drugs, or dietary supplements you use. Also tell them if you smoke, drink alcohol, or use illegal drugs. Some items may interact with your medicine. What should I watch for while using this medicine? This medicine is made from human blood.  It may be possible to pass an infection in this medicine. Talk to your doctor about the risks and benefits of this medicine. This medicine may interfere with live virus vaccines. Before you get live virus vaccines tell your health care professional if you have received this medicine within the past 3 months. What side effects may I notice from receiving this medicine? Side effects that you should report to your doctor or health care professional as soon as possible: -allergic reactions like skin rash, itching or hives, swelling of the face, lips, or tongue -breathing problems -chest pain or tightness -yellowing of the eyes or skin Side effects that usually do not require medical attention (report to your doctor or health care professional if they continue or are bothersome): -fever -pain and tenderness at site where injected This list may not describe all possible side effects. Call your doctor for medical advice about side effects. You may report side effects to FDA at 1-800-FDA-1088. Where should I keep my medicine? This drug is given in a hospital or clinic and will not be stored at home. NOTE: This sheet is a summary. It may not cover all possible information. If you have questions about this medicine, talk to your doctor, pharmacist, or health care provider.    2016, Elsevier/Gold Standard. (2008-08-08 14:06:10) Third Trimester of Pregnancy The third trimester is from week 29 through week 42, months 7 through 9. The third trimester is a time when the fetus is growing rapidly. At the end of the ninth month, the fetus is about 20 inches in length and  weighs 6-10 pounds.  BODY CHANGES Your body goes through many changes during pregnancy. The changes vary from woman to woman.   Your weight will continue to increase. You can expect to gain 25-35 pounds (11-16 kg) by the end of the pregnancy.  You may begin to get stretch marks on your hips, abdomen, and breasts.  You may urinate more often  because the fetus is moving lower into your pelvis and pressing on your bladder.  You may develop or continue to have heartburn as a result of your pregnancy.  You may develop constipation because certain hormones are causing the muscles that push waste through your intestines to slow down.  You may develop hemorrhoids or swollen, bulging veins (varicose veins).  You may have pelvic pain because of the weight gain and pregnancy hormones relaxing your joints between the bones in your pelvis. Backaches may result from overexertion of the muscles supporting your posture.  You may have changes in your hair. These can include thickening of your hair, rapid growth, and changes in texture. Some women also have hair loss during or after pregnancy, or hair that feels dry or thin. Your hair will most likely return to normal after your baby is born.  Your breasts will continue to grow and be tender. A yellow discharge may leak from your breasts called colostrum.  Your belly button may stick out.  You may feel short of breath because of your expanding uterus.  You may notice the fetus "dropping," or moving lower in your abdomen.  You may have a bloody mucus discharge. This usually occurs a few days to a week before labor begins.  Your cervix becomes thin and soft (effaced) near your due date. WHAT TO EXPECT AT YOUR PRENATAL EXAMS  You will have prenatal exams every 2 weeks until week 36. Then, you will have weekly prenatal exams. During a routine prenatal visit:  You will be weighed to make sure you and the fetus are growing normally.  Your blood pressure is taken.  Your abdomen will be measured to track your baby's growth.  The fetal heartbeat will be listened to.  Any test results from the previous visit will be discussed.  You may have a cervical check near your due date to see if you have effaced. At around 36 weeks, your caregiver will check your cervix. At the same time, your caregiver  will also perform a test on the secretions of the vaginal tissue. This test is to determine if a type of bacteria, Group B streptococcus, is present. Your caregiver will explain this further. Your caregiver may ask you:  What your birth plan is.  How you are feeling.  If you are feeling the baby move.  If you have had any abnormal symptoms, such as leaking fluid, bleeding, severe headaches, or abdominal cramping.  If you are using any tobacco products, including cigarettes, chewing tobacco, and electronic cigarettes.  If you have any questions. Other tests or screenings that may be performed during your third trimester include:  Blood tests that check for low iron levels (anemia).  Fetal testing to check the health, activity level, and growth of the fetus. Testing is done if you have certain medical conditions or if there are problems during the pregnancy.  HIV (human immunodeficiency virus) testing. If you are at high risk, you may be screened for HIV during your third trimester of pregnancy. FALSE LABOR You may feel small, irregular contractions that eventually go away. These are called  Braxton Hicks contractions, or false labor. Contractions may last for hours, days, or even weeks before true labor sets in. If contractions come at regular intervals, intensify, or become painful, it is best to be seen by your caregiver.  SIGNS OF LABOR   Menstrual-like cramps.  Contractions that are 5 minutes apart or less.  Contractions that start on the top of the uterus and spread down to the lower abdomen and back.  A sense of increased pelvic pressure or back pain.  A watery or bloody mucus discharge that comes from the vagina. If you have any of these signs before the 37th week of pregnancy, call your caregiver right away. You need to go to the hospital to get checked immediately. HOME CARE INSTRUCTIONS   Avoid all smoking, herbs, alcohol, and unprescribed drugs. These chemicals affect  the formation and growth of the baby.  Do not use any tobacco products, including cigarettes, chewing tobacco, and electronic cigarettes. If you need help quitting, ask your health care provider. You may receive counseling support and other resources to help you quit.  Follow your caregiver's instructions regarding medicine use. There are medicines that are either safe or unsafe to take during pregnancy.  Exercise only as directed by your caregiver. Experiencing uterine cramps is a good sign to stop exercising.  Continue to eat regular, healthy meals.  Wear a good support bra for breast tenderness.  Do not use hot tubs, steam rooms, or saunas.  Wear your seat belt at all times when driving.  Avoid raw meat, uncooked cheese, cat litter boxes, and soil used by cats. These carry germs that can cause birth defects in the baby.  Take your prenatal vitamins.  Take 1500-2000 mg of calcium daily starting at the 20th week of pregnancy until you deliver your baby.  Try taking a stool softener (if your caregiver approves) if you develop constipation. Eat more high-fiber foods, such as fresh vegetables or fruit and whole grains. Drink plenty of fluids to keep your urine clear or pale yellow.  Take warm sitz baths to soothe any pain or discomfort caused by hemorrhoids. Use hemorrhoid cream if your caregiver approves.  If you develop varicose veins, wear support hose. Elevate your feet for 15 minutes, 3-4 times a day. Limit salt in your diet.  Avoid heavy lifting, wear low heal shoes, and practice good posture.  Rest a lot with your legs elevated if you have leg cramps or low back pain.  Visit your dentist if you have not gone during your pregnancy. Use a soft toothbrush to brush your teeth and be gentle when you floss.  A sexual relationship may be continued unless your caregiver directs you otherwise.  Do not travel far distances unless it is absolutely necessary and only with the approval of  your caregiver.  Take prenatal classes to understand, practice, and ask questions about the labor and delivery.  Make a trial run to the hospital.  Pack your hospital bag.  Prepare the baby's nursery.  Continue to go to all your prenatal visits as directed by your caregiver. SEEK MEDICAL CARE IF:  You are unsure if you are in labor or if your water has broken.  You have dizziness.  You have mild pelvic cramps, pelvic pressure, or nagging pain in your abdominal area.  You have persistent nausea, vomiting, or diarrhea.  You have a bad smelling vaginal discharge.  You have pain with urination. SEEK IMMEDIATE MEDICAL CARE IF:   You have a fever.  You are leaking fluid from your vagina.  You have spotting or bleeding from your vagina.  You have severe abdominal cramping or pain.  You have rapid weight loss or gain.  You have shortness of breath with chest pain.  You notice sudden or extreme swelling of your face, hands, ankles, feet, or legs.  You have not felt your baby move in over an hour.  You have severe headaches that do not go away with medicine.  You have vision changes.   This information is not intended to replace advice given to you by your health care provider. Make sure you discuss any questions you have with your health care provider.   Document Released: 12/03/2001 Document Revised: 12/30/2014 Document Reviewed: 02/09/2013 Elsevier Interactive Patient Education [employer]2016 Elsevier Inc.

## 2015-11-03 NOTE — Progress Notes (Signed)
ROB- C/O of intermittent uc's and irritabitilty with activity that resolves with rest. Has remained on pelvic rest. Reinforced hydration needs, danger signs. Otherwise feels well with no new C/O's. PTL precautions and danger signs reviewed. To continue pelvic rest at this time, will take OOW for remainder of pregnancy. Tdap and Rhogam given today with blood consent signed, info on cord blood donation given,  Seen with Khamiya Varin Ines BloomerBurr, CNM  Glennie Isleassandra Elder, SNM

## 2015-11-10 ENCOUNTER — Other Ambulatory Visit: Payer: Self-pay | Admitting: Obstetrics and Gynecology

## 2015-11-21 ENCOUNTER — Ambulatory Visit (INDEPENDENT_AMBULATORY_CARE_PROVIDER_SITE_OTHER): Payer: 59 | Admitting: Certified Nurse Midwife

## 2015-11-21 VITALS — BP 125/66 | HR 86 | Wt 173.2 lb

## 2015-11-21 DIAGNOSIS — Z331 Pregnant state, incidental: Secondary | ICD-10-CM

## 2015-11-21 DIAGNOSIS — Z3490 Encounter for supervision of normal pregnancy, unspecified, unspecified trimester: Secondary | ICD-10-CM

## 2015-11-21 LAB — POCT URINALYSIS DIPSTICK
Bilirubin, UA: NEGATIVE
Blood, UA: NEGATIVE
GLUCOSE UA: NEGATIVE
KETONES UA: NEGATIVE
LEUKOCYTES UA: NEGATIVE
Nitrite, UA: NEGATIVE
PROTEIN UA: NEGATIVE
Spec Grav, UA: 1.015
Urobilinogen, UA: NEGATIVE
pH, UA: 6.5

## 2015-11-21 NOTE — Progress Notes (Signed)
ROB-Pt denies contractions.  She states when she is over active she has mild cramping & pressure but nothing she can time.  Preterm labor signs and symptoms reviewed to call if ensues.  Fetal kick counts reviewed.

## 2015-11-25 ENCOUNTER — Observation Stay
Admission: EM | Admit: 2015-11-25 | Discharge: 2015-11-25 | Disposition: A | Discharge status: Home / Self Care | Payer: 59 | Attending: Obstetrics and Gynecology | Admitting: Obstetrics and Gynecology

## 2015-11-25 DIAGNOSIS — O26893 Other specified pregnancy related conditions, third trimester: Principal | ICD-10-CM | POA: Insufficient documentation

## 2015-11-25 DIAGNOSIS — R109 Unspecified abdominal pain: Secondary | ICD-10-CM | POA: Insufficient documentation

## 2015-11-25 DIAGNOSIS — Z3A31 31 weeks gestation of pregnancy: Secondary | ICD-10-CM | POA: Insufficient documentation

## 2015-11-25 DIAGNOSIS — O26899 Other specified pregnancy related conditions, unspecified trimester: Secondary | ICD-10-CM

## 2015-11-25 LAB — URINALYSIS COMPLETE WITH MICROSCOPIC (ARMC ONLY)
BILIRUBIN URINE: NEGATIVE
Glucose, UA: 50 mg/dL — AB
KETONES UR: NEGATIVE mg/dL
LEUKOCYTES UA: NEGATIVE
NITRITE: NEGATIVE
PH: 6 (ref 5.0–8.0)
PROTEIN: NEGATIVE mg/dL
SPECIFIC GRAVITY, URINE: 1.01 (ref 1.005–1.030)

## 2015-11-25 LAB — FETAL FIBRONECTIN: Fetal Fibronectin: NEGATIVE

## 2015-11-25 NOTE — Evaluation (Addendum)
Pt reports pain at 1851, palpated abdomen, soft and non tender to touch, no contractions palpated, fetal movement palpated and heard audibly on monitor.  [id number] Spoke with Melody Nicanor AlconBurr Shambley, CNM, report given, CNM order to watch additional 20 min, if no ctx, reassure and discharge home

## 2015-11-25 NOTE — Progress Notes (Signed)
CNM Burr  given update. States that she is still comfortable with pt going home. CNM states that pt and she have reviewed PTL precautions thorough ally and does not require further instructions on d/c. Aware FFN results not back as of yet, and will review later this evening when she comes into unit

## 2015-11-25 NOTE — Progress Notes (Signed)
Pt left walking with spouse, carrying d/c papers. Noote pt has signed d/c paper- but has taken copy with her.

## 2015-11-25 NOTE — Progress Notes (Signed)
FFN swab obtained. Sent to lab- lab notified. Urine speciman obtained as ordered and sent

## 2015-11-25 NOTE — Progress Notes (Signed)
Pt expressing desire to go home and wait on results of tests there. Lab called as urine results back, but awaiting FFN. Lab states approx 10-15 min more. Pt and spouse notified, pt states she will wait in hospital for results.

## 2015-11-25 NOTE — Progress Notes (Signed)
Pt states she is comfortable/knowlegable with information re PTL precautions- as have reviewed extensively with CNM previously. Encouraged to push clear fluids. Pt agreeable. Pt to call office on Mon am to check if appointment needs to change- currently states it is 2 weeks from now

## 2015-11-25 NOTE — OB Triage Note (Signed)
[name] here with c/o sharp abdominal pain that started around 8 am, hasn't timed pain, but reports as "frequent, maybe 5 times in an hour" - denies bleeding, LOF, reports positive fetal movement. Pr reports BMZ injections 3-4 weeks ago for ctx at that time.

## 2015-11-27 ENCOUNTER — Telehealth: Payer: Self-pay | Admitting: Certified Nurse Midwife

## 2015-11-27 NOTE — Telephone Encounter (Signed)
I called pt back at 4pm after speaking with Midwife and pt was given reassurance that her FFN results were negative and they are good for 2wks. She is to continue PTL precautions. Pt was offered workin appt today or tomorrow but declined. Pt states she just wanted to know where pain was coming from. I informed pt that sometimes just baby growing/laying, strethching uterus, muscles, and nerves causes pain. Pt to go to L&D if after office hours go to L&D and if she changes her mind about an appt to contact office otherwise.

## 2015-11-27 NOTE — Telephone Encounter (Signed)
Pt states her pain is like bad menstrual cramps, sharps pain top and bottom of stomach. Pt states while at L&D she was positive for contractions but nothing regular, pelvic exam: cervix neg for dilation, and FFN was negative. Pt encouraged to take Tylenol ES as directed, lie on left side and drink plenty of fluids. I will contact her when I have spoken to midwife.

## 2015-11-27 NOTE — Telephone Encounter (Signed)
Patient called stating she was seen in the ER for abdominal pain and just ran some tests but didn't do anything but was told that Melody would call her with results which has not happened. She is [redacted] weeks pregnant and is still having the same pain. Please Advise.

## 2015-11-30 ENCOUNTER — Telehealth: Payer: Self-pay | Admitting: *Deleted

## 2015-11-30 ENCOUNTER — Other Ambulatory Visit: Payer: Self-pay | Admitting: *Deleted

## 2015-11-30 MED ORDER — PROMETHAZINE HCL 12.5 MG RE SUPP: 12.5000 mg | Freq: Four times a day (QID) | RECTAL | Status: DC | PRN

## 2015-11-30 NOTE — Telephone Encounter (Signed)
pls advise

## 2015-11-30 NOTE — Telephone Encounter (Signed)
Angela Shelton keeps throwing up, started Monday and it stopped and then started back today. Today 4 times, she is taking zofran.

## 2015-12-01 ENCOUNTER — Encounter: Payer: Self-pay | Admitting: Obstetrics and Gynecology

## 2015-12-01 ENCOUNTER — Ambulatory Visit (INDEPENDENT_AMBULATORY_CARE_PROVIDER_SITE_OTHER): Payer: 59 | Admitting: Obstetrics and Gynecology

## 2015-12-01 VITALS — BP 129/73 | HR 87 | Wt 174.8 lb

## 2015-12-01 DIAGNOSIS — K2970 Gastritis, unspecified, without bleeding: Secondary | ICD-10-CM

## 2015-12-01 DIAGNOSIS — Z3493 Encounter for supervision of normal pregnancy, unspecified, third trimester: Secondary | ICD-10-CM | POA: Diagnosis not present

## 2015-12-01 LAB — POCT URINALYSIS DIPSTICK
Glucose, UA: NEGATIVE
KETONES UA: NEGATIVE
Leukocytes, UA: NEGATIVE
Nitrite, UA: NEGATIVE
RBC UA: NEGATIVE
SPEC GRAV UA: 1.015
UROBILINOGEN UA: 0.2
pH, UA: 6

## 2015-12-01 NOTE — Progress Notes (Signed)
OB work in- pt c/o vomiting x 8 times yesterday, today she has diarrhea

## 2015-12-01 NOTE — Progress Notes (Signed)
Problem OB- denies fever- sudden onset vomiting and diarrhea- able to keep liquids in this morning, denies abdominal pain- GI bug- rx sent in for antiemetic, and OK to use OTC Immodium

## 2015-12-05 ENCOUNTER — Ambulatory Visit (INDEPENDENT_AMBULATORY_CARE_PROVIDER_SITE_OTHER): Payer: 59 | Admitting: Obstetrics and Gynecology

## 2015-12-05 ENCOUNTER — Encounter: Payer: Self-pay | Admitting: Obstetrics and Gynecology

## 2015-12-05 VITALS — BP 123/74 | HR 94 | Wt 176.3 lb

## 2015-12-05 DIAGNOSIS — Z3493 Encounter for supervision of normal pregnancy, unspecified, third trimester: Secondary | ICD-10-CM

## 2015-12-05 LAB — POCT URINALYSIS DIPSTICK
Ketones, UA: NEGATIVE
Leukocytes, UA: NEGATIVE
NITRITE UA: NEGATIVE
PH UA: 5
Spec Grav, UA: 1.015
UROBILINOGEN UA: 0.2

## 2015-12-05 MED ORDER — PANTOPRAZOLE SODIUM 40 MG PO TBEC: 40.0000 mg | DELAYED_RELEASE_TABLET | Freq: Every day | ORAL | Status: DC

## 2015-12-05 NOTE — Progress Notes (Signed)
ROB- reiterated normal symptoms in pregnancy stated below, states zantac is not working- changed to UnumProvidentprotonix- RX sent in.

## 2015-12-05 NOTE — Progress Notes (Signed)
ROB-pt is c/o being "hot" all the time, pt is having some period cramps, some pelvic pressure

## 2015-12-20 ENCOUNTER — Other Ambulatory Visit: Payer: Self-pay | Admitting: *Deleted

## 2015-12-20 NOTE — Telephone Encounter (Signed)
Pt notified of dental note was ready

## 2015-12-21 ENCOUNTER — Ambulatory Visit (INDEPENDENT_AMBULATORY_CARE_PROVIDER_SITE_OTHER): Payer: 59 | Admitting: Obstetrics and Gynecology

## 2015-12-21 VITALS — BP 137/81 | HR 104 | Wt 180.3 lb

## 2015-12-21 DIAGNOSIS — Z331 Pregnant state, incidental: Secondary | ICD-10-CM

## 2015-12-21 DIAGNOSIS — Z3493 Encounter for supervision of normal pregnancy, unspecified, third trimester: Secondary | ICD-10-CM | POA: Diagnosis not present

## 2015-12-21 LAB — POCT URINALYSIS DIPSTICK
Bilirubin, UA: NEGATIVE
Glucose, UA: NEGATIVE
Ketones, UA: 15
Leukocytes, UA: NEGATIVE
Nitrite, UA: NEGATIVE
PH UA: 6
PROTEIN UA: NEGATIVE
SPEC GRAV UA: 1.01
UROBILINOGEN UA: 0.2

## 2015-12-21 MED ORDER — CEFTRIAXONE SODIUM 500 MG IJ SOLR
500.0000 mg | Freq: Once | INTRAMUSCULAR | Status: AC
  Administered 2015-12-21: 500 mg via INTRAMUSCULAR

## 2015-12-21 NOTE — Progress Notes (Signed)
ROB- pt c/o L side tooth pain, went to dentist 12/20/15, was told she has infection  Pt is having numbness and tingling in her hands

## 2015-12-21 NOTE — Progress Notes (Signed)
ROB- tearful due to mouth pain- seen at Dr Joesphine BareGreeson (dentist) yesterday and told has tooth abcess- started on Zpac and hydrocodone, states pain worse today and left jaw very swollen. Rocephin 500mg  IM given today and note given to be re-evaluated by dentist. Instructed to let us know if she starts contracting, fever >100, or worsening symptoms in general. Temp 99.5

## 2015-12-21 NOTE — Patient Instructions (Signed)

## 2015-12-22 ENCOUNTER — Telehealth: Payer: Self-pay | Admitting: Obstetrics and Gynecology

## 2015-12-22 NOTE — Telephone Encounter (Signed)
fyi

## 2015-12-22 NOTE — Telephone Encounter (Signed)
Angela Shelton IS TAKING NEW MEDICATION FOR HER INFECTION IN MOUTH AND TOOTH:  CLINDAMYCIN 300 MG 4 XDAILY CHLORHEXIBINE MOUTH WASH

## 2015-12-24 NOTE — L&D Delivery Note (Signed)
Delivery Note At  a viable and healthy female "Angela Shelton" was delivered via  (Presentation: ;LOA  ).  APGAR:8 ,9 ; weight  .   Placenta status: delivered intact, small size with mec staining and 3 vessel Cord:  with the following complications:MSAF .    Anesthesia:  epidural Episiotomy:  none Lacerations:  none Suture Repair: NA Est. Blood Loss (mL):300    Mom to postpartum.  Baby to Couplet care / Skin to Skin.  Melody NIKE ,CNM  01/29/2016, 9:40 PM

## 2015-12-27 ENCOUNTER — Telehealth: Payer: Self-pay | Admitting: Obstetrics and Gynecology

## 2015-12-27 NOTE — Telephone Encounter (Signed)
She saw her labs on mychart and has some questions

## 2016-01-01 NOTE — Telephone Encounter (Signed)
Discussed with pt she saw where she had some trace of blood in her urine and wanted to make sure everything was alright

## 2016-01-03 ENCOUNTER — Encounter: Payer: 59 | Admitting: Obstetrics and Gynecology

## 2016-01-04 ENCOUNTER — Ambulatory Visit (INDEPENDENT_AMBULATORY_CARE_PROVIDER_SITE_OTHER): Payer: BLUE CROSS/BLUE SHIELD | Admitting: Obstetrics and Gynecology

## 2016-01-04 VITALS — BP 137/78 | HR 112 | Wt 185.3 lb

## 2016-01-04 DIAGNOSIS — O99013 Anemia complicating pregnancy, third trimester: Secondary | ICD-10-CM

## 2016-01-04 DIAGNOSIS — Z3483 Encounter for supervision of other normal pregnancy, third trimester: Secondary | ICD-10-CM

## 2016-01-04 DIAGNOSIS — Z3493 Encounter for supervision of normal pregnancy, unspecified, third trimester: Secondary | ICD-10-CM

## 2016-01-04 DIAGNOSIS — Z369 Encounter for antenatal screening, unspecified: Secondary | ICD-10-CM

## 2016-01-04 DIAGNOSIS — Z36 Encounter for antenatal screening of mother: Secondary | ICD-10-CM

## 2016-01-04 LAB — POCT URINALYSIS DIPSTICK
Bilirubin, UA: NEGATIVE
GLUCOSE UA: NEGATIVE
Ketones, UA: NEGATIVE
Leukocytes, UA: NEGATIVE
NITRITE UA: NEGATIVE
RBC UA: NEGATIVE
SPEC GRAV UA: 1.025
UROBILINOGEN UA: NEGATIVE
pH, UA: 6

## 2016-01-04 NOTE — Progress Notes (Signed)
ROB: Patient c/o difficulty sleeping (able to fall asleep but awakens during the middle of the night for several hours). Discussed Unisom, Benadryl.  36 week cultures done. Desires recheck of Hgb for h/o anemia in pregnancy. PTL precautions given.  RTC in 1 week.

## 2016-01-05 LAB — HEMOGLOBIN AND HEMATOCRIT, BLOOD
HEMOGLOBIN: 9.5 g/dL — AB (ref 11.1–15.9)
Hematocrit: 28.6 % — ABNORMAL LOW (ref 34.0–46.6)

## 2016-01-06 LAB — GC/CHLAMYDIA PROBE AMP
CHLAMYDIA, DNA PROBE: NEGATIVE
NEISSERIA GONORRHOEAE BY PCR: NEGATIVE

## 2016-01-08 LAB — CULTURE, BETA STREP (GROUP B ONLY): STREP GP B CULTURE: NEGATIVE

## 2016-01-11 ENCOUNTER — Ambulatory Visit (INDEPENDENT_AMBULATORY_CARE_PROVIDER_SITE_OTHER): Payer: BLUE CROSS/BLUE SHIELD | Admitting: Obstetrics and Gynecology

## 2016-01-11 ENCOUNTER — Encounter: Payer: Self-pay | Admitting: Obstetrics and Gynecology

## 2016-01-11 VITALS — BP 129/72 | HR 92 | Wt 186.9 lb

## 2016-01-11 DIAGNOSIS — Z331 Pregnant state, incidental: Secondary | ICD-10-CM

## 2016-01-11 LAB — POCT URINALYSIS DIPSTICK
Blood, UA: NEGATIVE
GLUCOSE UA: NEGATIVE
Ketones, UA: NEGATIVE
LEUKOCYTES UA: NEGATIVE
NITRITE UA: NEGATIVE
Spec Grav, UA: 1.015
Urobilinogen, UA: 0.2
pH, UA: 5

## 2016-01-11 NOTE — Progress Notes (Signed)
ROB- pt isn't sleeping at all, pelvic pressure, cramping, pt states she is vomiting in the middle of the night

## 2016-01-11 NOTE — Progress Notes (Signed)
ROB- reviewed labor precautions

## 2016-01-15 ENCOUNTER — Encounter: Payer: Self-pay | Admitting: Obstetrics and Gynecology

## 2016-01-18 ENCOUNTER — Ambulatory Visit (INDEPENDENT_AMBULATORY_CARE_PROVIDER_SITE_OTHER): Payer: BLUE CROSS/BLUE SHIELD | Admitting: Obstetrics and Gynecology

## 2016-01-18 ENCOUNTER — Encounter: Payer: Self-pay | Admitting: Obstetrics and Gynecology

## 2016-01-18 VITALS — BP 135/80 | HR 96 | Wt 185.7 lb

## 2016-01-18 DIAGNOSIS — G4700 Insomnia, unspecified: Secondary | ICD-10-CM

## 2016-01-18 DIAGNOSIS — Z331 Pregnant state, incidental: Secondary | ICD-10-CM

## 2016-01-18 LAB — POCT URINALYSIS DIPSTICK
BILIRUBIN UA: NEGATIVE
Blood, UA: NEGATIVE
Glucose, UA: NEGATIVE
KETONES UA: NEGATIVE
Leukocytes, UA: NEGATIVE
Nitrite, UA: NEGATIVE
PH UA: 6
SPEC GRAV UA: 1.015
Urobilinogen, UA: 0.2

## 2016-01-18 MED ORDER — ZOLPIDEM TARTRATE 5 MG PO TABS: 5.0000 mg | ORAL_TABLET | Freq: Every evening | ORAL | Status: DC | PRN

## 2016-01-18 NOTE — Progress Notes (Signed)
ROB- doing fair- really tired from not sleeping, tried tylenol PM and benedryl with no relief- RX for  Ambien given and instructed on use. Will consider IOL on 2/7 if not delivered by then.

## 2016-01-18 NOTE — Progress Notes (Signed)
ROB- pt isn't sleeping at all, she is c/o headaches, sees some spots on [address]

## 2016-01-25 ENCOUNTER — Ambulatory Visit (INDEPENDENT_AMBULATORY_CARE_PROVIDER_SITE_OTHER): Payer: BLUE CROSS/BLUE SHIELD | Admitting: Obstetrics and Gynecology

## 2016-01-25 ENCOUNTER — Encounter: Payer: Self-pay | Admitting: Obstetrics and Gynecology

## 2016-01-25 VITALS — BP 135/70 | HR 107 | Wt 187.9 lb

## 2016-01-25 DIAGNOSIS — Z331 Pregnant state, incidental: Secondary | ICD-10-CM

## 2016-01-25 LAB — POCT URINALYSIS DIPSTICK
BILIRUBIN UA: NEGATIVE
Glucose, UA: NEGATIVE
Ketones, UA: NEGATIVE
LEUKOCYTES UA: NEGATIVE
NITRITE UA: NEGATIVE
PH UA: 5
PROTEIN UA: NEGATIVE
RBC UA: NEGATIVE
Spec Grav, UA: 1.01
UROBILINOGEN UA: 0.2

## 2016-01-25 NOTE — Progress Notes (Signed)
ROB- doing well, will sched. IOL for 01/28/16 pm

## 2016-01-25 NOTE — Progress Notes (Signed)
ROB- pt denies any changes, B feet swelling

## 2016-01-28 ENCOUNTER — Encounter: Payer: Self-pay | Admitting: *Deleted

## 2016-01-28 ENCOUNTER — Inpatient Hospital Stay
Admission: EM | Admit: 2016-01-28 | Discharge: 2016-01-31 | DRG: 775 | Disposition: A | Discharge status: Home / Self Care | Payer: BLUE CROSS/BLUE SHIELD | Attending: Obstetrics and Gynecology | Admitting: Obstetrics and Gynecology

## 2016-01-28 DIAGNOSIS — Z23 Encounter for immunization: Secondary | ICD-10-CM | POA: Diagnosis not present

## 2016-01-28 DIAGNOSIS — O9902 Anemia complicating childbirth: Principal | ICD-10-CM | POA: Diagnosis present

## 2016-01-28 DIAGNOSIS — Z880 Allergy status to penicillin: Secondary | ICD-10-CM | POA: Diagnosis not present

## 2016-01-28 DIAGNOSIS — Z79899 Other long term (current) drug therapy: Secondary | ICD-10-CM

## 2016-01-28 DIAGNOSIS — Z833 Family history of diabetes mellitus: Secondary | ICD-10-CM | POA: Diagnosis not present

## 2016-01-28 DIAGNOSIS — Z3403 Encounter for supervision of normal first pregnancy, third trimester: Secondary | ICD-10-CM | POA: Diagnosis not present

## 2016-01-28 DIAGNOSIS — Z3490 Encounter for supervision of normal pregnancy, unspecified, unspecified trimester: Secondary | ICD-10-CM

## 2016-01-28 DIAGNOSIS — Z3A40 40 weeks gestation of pregnancy: Secondary | ICD-10-CM | POA: Diagnosis present

## 2016-01-28 LAB — TYPE AND SCREEN
ABO/RH(D): A NEG
Antibody Screen: NEGATIVE

## 2016-01-28 LAB — CBC
HCT: 28.4 % — ABNORMAL LOW (ref 35.0–47.0)
Hemoglobin: 9.2 g/dL — ABNORMAL LOW (ref 12.0–16.0)
MCH: 28.3 pg (ref 26.0–34.0)
MCHC: 32.5 g/dL (ref 32.0–36.0)
MCV: 87 fL (ref 80.0–100.0)
PLATELETS: 284 10*3/uL (ref 150–440)
RBC: 3.26 MIL/uL — AB (ref 3.80–5.20)
RDW: 14.3 % (ref 11.5–14.5)
WBC: 15.4 10*3/uL — AB (ref 3.6–11.0)

## 2016-01-28 LAB — ABO/RH: ABO/RH(D): A NEG

## 2016-01-28 MED ORDER — CITRIC ACID-SODIUM CITRATE 334-500 MG/5ML PO SOLN: 30.0000 mL | ORAL | Status: DC | PRN

## 2016-01-28 MED ORDER — OXYCODONE-ACETAMINOPHEN 5-325 MG PO TABS: 1.0000 | ORAL_TABLET | ORAL | Status: DC | PRN

## 2016-01-28 MED ORDER — ZOLPIDEM TARTRATE 5 MG PO TABS
5.0000 mg | ORAL_TABLET | Freq: Once | ORAL | Status: AC
  Administered 2016-01-28: 5 mg via ORAL

## 2016-01-28 MED ORDER — ONDANSETRON HCL 4 MG/2ML IJ SOLN
4.0000 mg | Freq: Four times a day (QID) | INTRAMUSCULAR | Status: DC | PRN
  Administered 2016-01-29 (×2): 4 mg via INTRAVENOUS
  Filled 2016-01-28 (×2): qty 2

## 2016-01-28 MED ORDER — OXYCODONE-ACETAMINOPHEN 5-325 MG PO TABS: 2.0000 | ORAL_TABLET | ORAL | Status: DC | PRN

## 2016-01-28 MED ORDER — TERBUTALINE SULFATE 1 MG/ML IJ SOLN
0.2500 mg | Freq: Once | INTRAMUSCULAR | Status: AC | PRN
  Administered 2016-01-29: 0.25 mg via SUBCUTANEOUS
  Filled 2016-01-28: qty 1

## 2016-01-28 MED ORDER — ACETAMINOPHEN 325 MG PO TABS: 650.0000 mg | ORAL_TABLET | ORAL | Status: DC | PRN

## 2016-01-28 MED ORDER — LIDOCAINE HCL (PF) 1 % IJ SOLN: 30.0000 mL | INTRAMUSCULAR | Status: DC | PRN

## 2016-01-28 MED ORDER — ZOLPIDEM TARTRATE 5 MG PO TABS
ORAL_TABLET | ORAL | Status: AC
  Administered 2016-01-28: 5 mg via ORAL
  Filled 2016-01-28: qty 1

## 2016-01-28 MED ORDER — LACTATED RINGERS IV SOLN
500.0000 mL | INTRAVENOUS | Status: DC | PRN
  Administered 2016-01-28: 500 mL via INTRAVENOUS

## 2016-01-28 MED ORDER — OXYTOCIN 10 UNIT/ML IJ SOLN
2.5000 [IU]/h | INTRAVENOUS | Status: DC
  Filled 2016-01-28: qty 4

## 2016-01-28 MED ORDER — OXYTOCIN BOLUS FROM INFUSION: 500.0000 mL | INTRAVENOUS | Status: DC

## 2016-01-28 MED ORDER — FENTANYL CITRATE (PF) 100 MCG/2ML IJ SOLN
50.0000 ug | INTRAMUSCULAR | Status: DC | PRN
  Administered 2016-01-29: 50 ug via INTRAVENOUS
  Filled 2016-01-28: qty 2

## 2016-01-28 MED ORDER — LACTATED RINGERS IV SOLN
INTRAVENOUS | Status: DC
  Administered 2016-01-29: 02:00:00 via INTRAVENOUS

## 2016-01-28 MED ORDER — MISOPROSTOL 50MCG HALF TABLET
50.0000 ug | ORAL_TABLET | ORAL | Status: DC | PRN
  Administered 2016-01-28: 50 ug via VAGINAL
  Administered 2016-01-29: 25 ug via VAGINAL
  Filled 2016-01-28 (×2): qty 1

## 2016-01-29 ENCOUNTER — Inpatient Hospital Stay: Payer: BLUE CROSS/BLUE SHIELD | Admitting: Anesthesiology

## 2016-01-29 ENCOUNTER — Encounter: Payer: Self-pay | Admitting: Anesthesiology

## 2016-01-29 DIAGNOSIS — Z3403 Encounter for supervision of normal first pregnancy, third trimester: Secondary | ICD-10-CM

## 2016-01-29 MED ORDER — OXYTOCIN 40 UNITS IN LACTATED RINGERS INFUSION - SIMPLE MED
1.0000 m[IU]/min | INTRAVENOUS | Status: DC
  Administered 2016-01-29: 666 m[IU]/min via INTRAVENOUS
  Administered 2016-01-29: 1 m[IU]/min via INTRAVENOUS
  Filled 2016-01-29: qty 1000

## 2016-01-29 MED ORDER — LIDOCAINE HCL (PF) 1 % IJ SOLN
INTRAMUSCULAR | Status: AC
  Filled 2016-01-29: qty 30

## 2016-01-29 MED ORDER — MISOPROSTOL 200 MCG PO TABS
ORAL_TABLET | ORAL | Status: AC
  Filled 2016-01-29: qty 4

## 2016-01-29 MED ORDER — OXYTOCIN 10 UNIT/ML IJ SOLN
INTRAMUSCULAR | Status: AC
  Filled 2016-01-29: qty 2

## 2016-01-29 MED ORDER — BUTORPHANOL TARTRATE 1 MG/ML IJ SOLN
1.0000 mg | INTRAMUSCULAR | Status: DC | PRN
  Administered 2016-01-29 (×3): 1 mg via INTRAVENOUS
  Filled 2016-01-29 (×3): qty 1

## 2016-01-29 MED ORDER — FENTANYL 2.5 MCG/ML W/ROPIVACAINE 0.2% IN NS 100 ML EPIDURAL INFUSION (ARMC-ANES)
EPIDURAL | Status: AC
  Administered 2016-01-29: 10 mL/h via EPIDURAL
  Filled 2016-01-29: qty 100

## 2016-01-29 MED ORDER — AMMONIA AROMATIC IN INHA
RESPIRATORY_TRACT | Status: AC
  Filled 2016-01-29: qty 10

## 2016-01-29 MED ORDER — BUPIVACAINE HCL (PF) 0.25 % IJ SOLN
INTRAMUSCULAR | Status: DC | PRN
  Administered 2016-01-29: 5 mL via EPIDURAL

## 2016-01-29 NOTE — Progress Notes (Signed)
Angela Shelton is a 25 y.o. G1P0 at [redacted]w[redacted]d by LMP admitted for induction of labor due to Elective at term.  Subjective: Feeling pressure with contractions, epidural in place  Objective: BP 121/68 mmHg  Pulse 85  Temp(Src) 98.5 F (36.9 C) (Oral)  Resp 20  Ht  (1.626 m)  Wt 84.823 kg (187 lb)  BMI 32.08 kg/m2  LMP 04/24/2015 (Approximate) I/O last 3 completed shifts: In: 1022.9 [I.V.:1022.9] Out: -  Total I/O In: -  Out: 90 [Urine:90]  FHT:  FHR: 135 bpm, variability: moderate,  accelerations:  Present,  decelerations:  Absent UC:   regular, every 3 minutes on 90mu/min pitocin SVE:   Dilation: 10 Effacement (%): 100 Station: +2 Exam by:: JMG  Labs: Lab Results  Component Value Date   WBC 15.4* 01/28/2016   HGB 9.2* 01/28/2016   HCT 28.4* 01/28/2016   MCV 87.0 01/28/2016   PLT 284 01/28/2016    Assessment / Plan: begining second stage of labor  Labor: Progressing normally Preeclampsia:  labs stable Fetal Wellbeing:  Category II Pain Control:  Epidural I/D:  n/a Anticipated MOD:  NSVD  Melody N Shambley,CNM 01/29/2016, 9:06 PM

## 2016-01-29 NOTE — Anesthesia Preprocedure Evaluation (Signed)
Anesthesia Evaluation  Patient identified by MRN, date of birth, ID band Patient awake    Reviewed: Allergy & Precautions, NPO status , Patient's Chart, lab work & pertinent test results, reviewed documented beta blocker date and time   Airway Mallampati: II  TM Distance: >3 FB     Dental  (+) Chipped   Pulmonary           Cardiovascular      Neuro/Psych    GI/Hepatic   Endo/Other    Renal/GU      Musculoskeletal   Abdominal   Peds  Hematology  (+) anemia ,   Anesthesia Other Findings   Reproductive/Obstetrics                             Anesthesia Physical Anesthesia Plan  ASA: II  Anesthesia Plan: Epidural   Post-op Pain Management:    Induction:   Airway Management Planned:   Additional Equipment:   Intra-op Plan:   Post-operative Plan:   Informed Consent: I have reviewed the patients History and Physical, chart, labs and discussed the procedure including the risks, benefits and alternatives for the proposed anesthesia with the patient or authorized representative who has indicated his/her understanding and acceptance.     Plan Discussed with: CRNA  Anesthesia Plan Comments:         Anesthesia Quick Evaluation  

## 2016-01-29 NOTE — Progress Notes (Signed)
Pain assessment 8/10, lot of pressure in the pelvic area, no back pain;  pt. Refuses pain medication, "I don't like the way it makes me feel."  Pt. Encouraged to reposition, sit on birthing ball, take shower for pain control; pt. Desires to  sitting on birthing ball.  FHR baseline is 135 bpm, mod. variability,with 15x15 accels, no decels noted, contracting every 1.5-2 min, each contraction lasting 40-70 sec.  Spouse remains at the beside. RN will continue to monitor.

## 2016-01-29 NOTE — Anesthesia Procedure Notes (Signed)
Epidural Patient location during procedure: OB  Staffing Anesthesiologist: Berdine Addison Performed by: anesthesiologist   Preanesthetic Checklist Completed: patient identified, site marked, surgical consent, pre-op evaluation, timeout performed, IV checked, risks and benefits discussed and monitors and equipment checked  Epidural Patient position: sitting Prep: Betadine Patient monitoring: heart rate, continuous pulse ox and blood pressure Approach: midline Location: L4-L5 Injection technique: LOR saline  Needle:  Needle type: Tuohy  Needle gauge: 18 G Needle length: 9 cm and 9 Catheter type: closed end flexible Catheter size: 20 Guage Test dose: negative and 1.5% lidocaine with Epi 1:200 K  Assessment Sensory level: T10 Events: blood not aspirated, injection not painful, no injection resistance, negative IV test and no paresthesia  Additional Notes   Patient tolerated the insertion well without complications. In 1610. Catheter in 1623. Test 1626. Bolus 1628. Infusion 1633.Reason for block:procedure for pain

## 2016-01-29 NOTE — H&P (Signed)
Obstetric History and Physical  Angela Shelton is a 25 y.o. G1P0 with IUP at [redacted]w[redacted]d presenting with term induction last night. Patient states she has been having  irregular, every ? minutes contractions, none vaginal bleeding, intact membranes, with active fetal movement.    Prenatal Course Source of Care: [facility]  Pregnancy complications or risks:PTL at 26 weeks-resolved, tooth abcess at 34 weeks, moderate swelling and intermittent elevated BP  Prenatal labs and studies: ABO, Rh: --/--/A NEG (02/05 2208) Antibody: NEG (02/05 2206) Rubella: 2.70 (08/30 1234) RPR: Non Reactive (08/30 1234)  HBsAg: Negative (07/08 0421)  HIV: Non Reactive (07/08 0421)  GBS: negative 1 hr Glucola  Elevated, with normal 3 hr Genetic screening normal Anatomy US normal  History reviewed. No pertinent past medical history.  History reviewed. No pertinent past surgical history.  OB History  Gravida Para Term Preterm AB SAB TAB Ectopic Multiple Living  1             # Outcome Date GA Lbr Len/2nd Weight Sex Delivery Anes PTL Lv  1 Current               Social History   Social History  . Marital Status: Married    Spouse Name: N/A  . Number of Children: N/A  . Years of Education: N/A   Social History Main Topics  . Smoking status: Never Smoker   . Smokeless tobacco: Never Used  . Alcohol Use: No  . Drug Use: No  . Sexual Activity: Yes    Birth Control/ Protection: None   Other Topics Concern  . None   Social History Narrative    Family History  Problem Relation Age of Onset  . Diabetes Sister     Prescriptions prior to admission  Medication Sig Dispense Refill Last Dose  . Iron-FA-B Cmp-C-Biot-Probiotic (FUSION PLUS) CAPS Take 1 capsule by mouth daily. 60 capsule 1 Taking  . ondansetron (ZOFRAN-ODT) 8 MG disintegrating tablet dissolve 1 tablet ON TONGUE every 6 hours 30 tablet 2 Taking  . pantoprazole (PROTONIX) 40 MG tablet Take 1 tablet (40 mg total) by mouth daily. 30 tablet 3 Taking   . Prenatal Vit-Fe Fumarate-FA (PRENATAL VITAMINS) 28-0.8 MG TABS Take 1 tablet by mouth daily.   Taking  . zolpidem (AMBIEN) 5 MG tablet Take 1 tablet (5 mg total) by mouth at bedtime as needed for sleep. 20 tablet 0 Taking    Allergies  Allergen Reactions  . Penicillins Nausea And Vomiting    All pcn's    Review of Systems: Negative except for what is mentioned in HPI.  Physical Exam: BP 129/86 mmHg  Pulse 90  Temp(Src) 98.5 F (36.9 C) (Oral)  Resp 18  Ht  (1.626 m)  Wt 84.823 kg (187 lb)  BMI 32.08 kg/m2  LMP 04/24/2015 (Approximate) GENERAL: Well-developed, well-nourished female in no acute distress.  LUNGS: Clear to auscultation bilaterally.  HEART: Regular rate and rhythm. ABDOMEN: Soft, nontender, nondistended, gravid. EXTREMITIES: Nontender, no edema, 2+ distal pulses. Cervical Exam: Dilation: 1.5 Effacement (%): 60 Cervical Position: Anterior Station: 0 Presentation: Vertex Exam by:: Millner, RN FHT:  Baseline rate 135 bpm   Variability moderate  Accelerations present   Decelerations none Contractions: Every 2-3 mins, moderate to palpation   Pertinent Labs/Studies:   Results for orders placed or performed during the hospital encounter of 01/28/16 (from the past 24 hour(s))  CBC     Status: Abnormal   Collection Time: 01/28/16 10:06 PM  Result Value Ref  Range   WBC 15.4 (H) 3.6 - 11.0 K/uL   RBC 3.26 (L) 3.80 - 5.20 MIL/uL   Hemoglobin 9.2 (L) 12.0 - 16.0 g/dL   HCT 16.1 (L) 09.6 - 04.5 %   MCV 87.0 80.0 - 100.0 fL   MCH 28.3 26.0 - 34.0 pg   MCHC 32.5 32.0 - 36.0 g/dL   RDW 40.9 81.1 - 91.4 %   Platelets 284 150 - 440 K/uL  Type and screen     Status: None   Collection Time: 01/28/16 10:06 PM  Result Value Ref Range   ABO/RH(D) A NEG    Antibody Screen NEG    Sample Expiration 01/31/2016   ABO/Rh     Status: None   Collection Time: 01/28/16 10:08 PM  Result Value Ref Range   ABO/RH(D) A NEG     Assessment : Angela Shelton is a 25 y.o. G1P0  at [redacted]w[redacted]d being admitted for labor.  Plan: Labor: Cytotec started last night at 10pm per protocol, only 2 doses placed, has been laboring through the night and tolerating contractions poorly, did not want more IV fentanyl as states it didn't help. Encouraged to ambulate, will consider pitocin.Epidural OK when >4.5cm FWB: Reassuring fetal heart tracing.  GBS negative Delivery plan: Hopeful for vaginal delivery  Ipek Westra, CNM [facility]

## 2016-01-29 NOTE — Progress Notes (Signed)
Angela Shelton is a 25 y.o. G1P0 at [redacted]w[redacted]d by LMP admitted for induction of labor due to Elective at term and elevated blood pressures.  Subjective:   Objective: BP 126/80 mmHg  Pulse 90  Temp(Src) 98.2 F (36.8 C) (Oral)  Resp 20  Ht  (1.626 m)  Wt 84.823 kg (187 lb)  BMI 32.08 kg/m2  LMP 04/24/2015 (Approximate) I/O last 3 completed shifts: In: 1022.9 [I.V.:1022.9] Out: -     FHT:  FHR: 148 bpm, variability: moderate,  accelerations:  Present,  decelerations:  Absent UC:   regular, every 2-3 minutes SVE:   5/90/0, AROM with light MSAF noted Labs: Lab Results  Component Value Date   WBC 15.4* 01/28/2016   HGB 9.2* 01/28/2016   HCT 28.4* 01/28/2016   MCV 87.0 01/28/2016   PLT 284 01/28/2016    Assessment / Plan: Induction of labor due to term elective,  progressing well on pitocin; counseled on MSAF and delivery, IUPC placed. Will make nursery aware.  Labor: Progressing on Pitocin, will continue to increase then AROM Preeclampsia:  labs stable Fetal Wellbeing:  Category II Pain Control:  Epidural I/D:  n/a Anticipated MOD:  NSVD  Rachella Basden [name], CNM 01/29/2016, 7:28 PM

## 2016-01-30 ENCOUNTER — Encounter: Payer: Self-pay | Admitting: *Deleted

## 2016-01-30 LAB — CBC
HEMATOCRIT: 28.3 % — AB (ref 35.0–47.0)
HEMOGLOBIN: 9.1 g/dL — AB (ref 12.0–16.0)
MCH: 27.9 pg (ref 26.0–34.0)
MCHC: 32.1 g/dL (ref 32.0–36.0)
MCV: 86.9 fL (ref 80.0–100.0)
Platelets: 253 10*3/uL (ref 150–440)
RBC: 3.25 MIL/uL — AB (ref 3.80–5.20)
RDW: 14.2 % (ref 11.5–14.5)
WBC: 20.9 10*3/uL — AB (ref 3.6–11.0)

## 2016-01-30 LAB — FETAL SCREEN: FETAL SCREEN: NEGATIVE

## 2016-01-30 LAB — RPR: RPR Ser Ql: NONREACTIVE

## 2016-01-30 MED ORDER — DOCUSATE SODIUM 100 MG PO CAPS
100.0000 mg | ORAL_CAPSULE | Freq: Two times a day (BID) | ORAL | Status: DC
  Administered 2016-01-30 – 2016-01-31 (×3): 100 mg via ORAL
  Filled 2016-01-30 (×3): qty 1

## 2016-01-30 MED ORDER — RHO D IMMUNE GLOBULIN 1500 UNIT/2ML IJ SOSY
300.0000 ug | PREFILLED_SYRINGE | Freq: Once | INTRAMUSCULAR | Status: AC
  Administered 2016-01-30: 300 ug via INTRAMUSCULAR
  Filled 2016-01-30: qty 2

## 2016-01-30 MED ORDER — PRENATAL MULTIVITAMIN CH
1.0000 | ORAL_TABLET | Freq: Every day | ORAL | Status: DC
  Administered 2016-01-30 – 2016-01-31 (×2): 1 via ORAL
  Filled 2016-01-30 (×2): qty 1

## 2016-01-30 MED ORDER — BENZOCAINE-MENTHOL 20-0.5 % EX AERO: 1.0000 "application " | INHALATION_SPRAY | CUTANEOUS | Status: DC | PRN

## 2016-01-30 MED ORDER — OXYCODONE HCL 5 MG PO TABS: 10.0000 mg | ORAL_TABLET | ORAL | Status: DC | PRN

## 2016-01-30 MED ORDER — IBUPROFEN 600 MG PO TABS
600.0000 mg | ORAL_TABLET | Freq: Four times a day (QID) | ORAL | Status: DC
  Administered 2016-01-30 – 2016-01-31 (×5): 600 mg via ORAL
  Filled 2016-01-30 (×6): qty 1

## 2016-01-30 MED ORDER — SIMETHICONE 80 MG PO CHEW: 80.0000 mg | CHEWABLE_TABLET | ORAL | Status: DC | PRN

## 2016-01-30 MED ORDER — OXYCODONE HCL 5 MG PO TABS: 5.0000 mg | ORAL_TABLET | ORAL | Status: DC | PRN

## 2016-01-30 MED ORDER — VARICELLA VIRUS VACCINE LIVE 1350 PFU/0.5ML IJ SUSR
0.5000 mL | Freq: Once | INTRAMUSCULAR | Status: AC
  Administered 2016-01-31: 0.5 mL via SUBCUTANEOUS
  Filled 2016-01-30: qty 0.5

## 2016-01-30 MED ORDER — DIBUCAINE 1 % RE OINT: 1.0000 "application " | TOPICAL_OINTMENT | RECTAL | Status: DC | PRN

## 2016-01-30 MED ORDER — WITCH HAZEL-GLYCERIN EX PADS: 1.0000 "application " | MEDICATED_PAD | CUTANEOUS | Status: DC | PRN

## 2016-01-30 MED ORDER — MEASLES, MUMPS & RUBELLA VAC ~~LOC~~ INJ
0.5000 mL | INJECTION | Freq: Once | SUBCUTANEOUS | Status: AC
  Administered 2016-01-31: 0.5 mL via SUBCUTANEOUS
  Filled 2016-01-30: qty 0.5

## 2016-01-30 MED ORDER — ONDANSETRON HCL 4 MG/2ML IJ SOLN: 4.0000 mg | INTRAMUSCULAR | Status: DC | PRN

## 2016-01-30 MED ORDER — DIPHENHYDRAMINE HCL 25 MG PO CAPS: 25.0000 mg | ORAL_CAPSULE | Freq: Four times a day (QID) | ORAL | Status: DC | PRN

## 2016-01-30 MED ORDER — ACETAMINOPHEN 325 MG PO TABS: 650.0000 mg | ORAL_TABLET | ORAL | Status: DC | PRN

## 2016-01-30 MED ORDER — LANOLIN HYDROUS EX OINT: TOPICAL_OINTMENT | CUTANEOUS | Status: DC | PRN

## 2016-01-30 MED ORDER — ONDANSETRON HCL 4 MG PO TABS
4.0000 mg | ORAL_TABLET | ORAL | Status: DC | PRN
  Administered 2016-01-30: 4 mg via ORAL
  Filled 2016-01-30: qty 1

## 2016-01-30 NOTE — Progress Notes (Signed)
Post Partum Day 1 Subjective: no complaints and voiding  Objective: Blood pressure 129/72, pulse 101, temperature 98.4 F (36.9 C), temperature source Oral, resp. rate 18, height  (1.626 m), weight 187 lb (84.823 kg), last menstrual period 04/24/2015, SpO2 99 %, unknown if currently breastfeeding.  Physical Exam:  General: alert, cooperative, appears stated age and pale Lochia: appropriate Uterine Fundus: firm Incision: NA DVT Evaluation: No evidence of DVT seen on physical exam. Calf/Ankle edema is present.   Recent Labs  01/28/16 2206 01/30/16 0633  HGB 9.2* 9.1*  HCT 28.4* 28.3*    Assessment/Plan: Plan for discharge tomorrow and Breastfeeding Infant feeding only Breast    LOS: 2 days   Soley Harriss N Elvie Palomo 01/30/2016, 3:36 PM

## 2016-01-30 NOTE — Lactation Note (Signed)
This note was copied from the chart of Angela Kissa Campoy. Lactation Consultation Note  Patient Name: Angela Shelton [id number] Date: 01/30/2016     Maternal Data  Right nipple slightly flat; left nipple very flat with slight crease midline (almost inverted). Nipple shield did not work well today until mom pre pumped. The 24 mm shield she was given was too large. 20 mm fir better. Mom practiced applying shield and using hand pump. Mom to pump 15 minutes any time poor feed/latch  Feeding Feeding Type: Breast Fed Length of feed: 30 min  LATCH Score/Interventions                      Lactation Tools Discussed/Used Tools: Nipple [facility]   Consult Status      Sunday Corn 01/30/2016, 7:23 PM

## 2016-01-30 NOTE — Anesthesia Postprocedure Evaluation (Signed)
Anesthesia Post Note  Patient: Angela Shelton  Procedure(s) Performed: * No procedures listed *  Patient location during evaluation: Mother Baby Anesthesia Type: Epidural Level of consciousness: awake and alert and oriented Pain management: satisfactory to patient Vital Signs Assessment: post-procedure vital signs reviewed and stable Respiratory status: respiratory function stable Cardiovascular status: stable Postop Assessment: no backache and no headache Anesthetic complications: no    Last Vitals:  Filed Vitals:   01/30/16 0200 01/30/16 0330  BP: 105/67 118/62  Pulse: 96 93  Temp: 37.1 C 36.9 C  Resp: 18 16    Last Pain:  Filed Vitals:   01/30/16 0406  PainSc: 2                  Clydene Pugh

## 2016-01-30 NOTE — Anesthesia Post-op Follow-up Note (Signed)
  Anesthesia Pain Follow-up Note  Patient: Angela Shelton  Day #: 1  Date of Follow-up: 01/30/2016 Time: 7:25 AM  Last Vitals:  Filed Vitals:   01/30/16 0200 01/30/16 0330  BP: 105/67 118/62  Pulse: 96 93  Temp: 37.1 C 36.9 C  Resp: 18 16    Level of Consciousness: alert  Pain: mild   Side Effects:None  Catheter Site Exam: site not evaluated  Plan: D/C from anesthesia care  Clydene Pugh

## 2016-01-31 LAB — RHOGAM INJECTION: Unit division: 0

## 2016-01-31 MED ORDER — VITAMIN D3 75 MCG (3000 UT) PO TABS: 1.0000 | ORAL_TABLET | Freq: Every day | ORAL | Status: DC

## 2016-01-31 MED ORDER — IBUPROFEN 600 MG PO TABS: 600.0000 mg | ORAL_TABLET | Freq: Four times a day (QID) | ORAL | Status: DC

## 2016-01-31 MED ORDER — DOCUSATE SODIUM 100 MG PO CAPS: 100.0000 mg | ORAL_CAPSULE | Freq: Two times a day (BID) | ORAL | Status: DC

## 2016-01-31 NOTE — Discharge Summary (Signed)
Obstetric Discharge Summary Reason for Admission: induction of labor Prenatal Procedures: NST and ultrasound Intrapartum Procedures: spontaneous vaginal delivery Postpartum Procedures: Rho(D) Ig, Rubella Ig and varicella vaccine Complications-Operative and Postpartum: none HEMOGLOBIN  Date Value Ref Range Status  01/30/2016 9.1* 12.0 - 16.0 g/dL Final   HGB  Date Value Ref Range Status  12/05/2012 12.7 12.0-16.0 g/dL Final   HCT  Date Value Ref Range Status  01/30/2016 28.3* 35.0 - 47.0 % Final  12/05/2012 38.2 35.0-47.0 % Final   HEMATOCRIT  Date Value Ref Range Status  01/04/2016 28.6* 34.0 - 46.6 % Final    Physical Exam:  General: alert, cooperative, appears stated age and pale Lochia: appropriate Uterine Fundus: firm Incision: NA DVT Evaluation: No evidence of DVT seen on physical exam. Calf/Ankle edema is present.  Discharge Diagnoses: Term Pregnancy-delivered and anemia  Discharge Information: Date: 01/31/2016 Activity: pelvic rest Diet: routine Medications: PNV, Ibuprofen, Colace, Iron and vitamin D Condition: stable Instructions: refer to practice specific booklet Discharge to: home, plans condom use   Newborn Data: Live born female [place] Birth Weight: 6 lb 6.7 oz (2910 g) APGAR: 8, 9  Home with mother.  Brena [place] [employer], CNM 01/31/2016, 9:18 AM

## 2016-01-31 NOTE — Progress Notes (Signed)
Discharge instructions complete. Patient verbalizes understanding of teaching. Patient discharged home at 1201.

## 2016-02-01 ENCOUNTER — Encounter: Payer: BLUE CROSS/BLUE SHIELD | Admitting: Obstetrics and Gynecology

## 2016-02-05 ENCOUNTER — Encounter: Payer: Self-pay | Admitting: Obstetrics and Gynecology

## 2016-02-26 ENCOUNTER — Telehealth: Payer: Self-pay | Admitting: Obstetrics and Gynecology

## 2016-02-26 NOTE — Telephone Encounter (Signed)
Burning, itching, inf? She wanted to come in but no availability.

## 2016-02-27 NOTE — Telephone Encounter (Signed)
Place on nurse schedule

## 2016-02-28 ENCOUNTER — Ambulatory Visit (INDEPENDENT_AMBULATORY_CARE_PROVIDER_SITE_OTHER): Payer: BLUE CROSS/BLUE SHIELD | Admitting: Obstetrics and Gynecology

## 2016-02-28 VITALS — BP 113/80 | HR 90 | Wt 159.5 lb

## 2016-02-28 DIAGNOSIS — B379 Candidiasis, unspecified: Secondary | ICD-10-CM | POA: Diagnosis not present

## 2016-02-28 DIAGNOSIS — R300 Dysuria: Secondary | ICD-10-CM | POA: Diagnosis not present

## 2016-02-28 LAB — POCT URINALYSIS DIPSTICK
Bilirubin, UA: NEGATIVE
Glucose, UA: NEGATIVE
KETONES UA: NEGATIVE
Leukocytes, UA: NEGATIVE
NITRITE UA: NEGATIVE
PH UA: 6
PROTEIN UA: NEGATIVE
RBC UA: NEGATIVE
Spec Grav, UA: 1.025
UROBILINOGEN UA: NEGATIVE

## 2016-02-28 MED ORDER — FLUCONAZOLE 150 MG PO TABS: 150.0000 mg | ORAL_TABLET | Freq: Once | ORAL | Status: DC

## 2016-02-28 NOTE — Progress Notes (Signed)
Patient ID: [name], female   DOB: 04/08/1991, 25 y.o.   MRN: [id number]  Microscopic wet-mount exam shows negative for pathogens, normal epithelial cells, lactobacilli.  A: yeast infection meds sent in and patient notified via phone,  [name], CNM

## 2016-02-28 NOTE — Progress Notes (Signed)
Patient ID: Angela Shelton, female   DOB: [birth year], 25 y.o.   MRN: [id number] Pt presents for burning with urination, itching, pressure, odor, unsure of vaginal discharge due to vaginal bleeding. Pt aware she will be notified via phone of results.

## 2016-03-03 ENCOUNTER — Encounter: Payer: Self-pay | Admitting: Obstetrics and Gynecology

## 2016-03-05 ENCOUNTER — Ambulatory Visit (INDEPENDENT_AMBULATORY_CARE_PROVIDER_SITE_OTHER): Payer: BLUE CROSS/BLUE SHIELD | Admitting: Obstetrics and Gynecology

## 2016-03-05 ENCOUNTER — Encounter: Payer: Self-pay | Admitting: Obstetrics and Gynecology

## 2016-03-05 VITALS — BP 140/88 | HR 84 | Wt 159.0 lb

## 2016-03-05 DIAGNOSIS — O99345 Other mental disorders complicating the puerperium: Secondary | ICD-10-CM

## 2016-03-05 DIAGNOSIS — B3731 Acute candidiasis of vulva and vagina: Secondary | ICD-10-CM

## 2016-03-05 DIAGNOSIS — B373 Candidiasis of vulva and vagina: Secondary | ICD-10-CM | POA: Diagnosis not present

## 2016-03-05 DIAGNOSIS — F53 Postpartum depression: Secondary | ICD-10-CM

## 2016-03-05 MED ORDER — ESCITALOPRAM OXALATE 10 MG PO TABS: 10.0000 mg | ORAL_TABLET | Freq: Every day | ORAL | Status: DC

## 2016-03-05 MED ORDER — TERCONAZOLE 0.4 % VA CREA: 1.0000 | TOPICAL_CREAM | Freq: Every day | VAGINAL | Status: DC

## 2016-03-05 NOTE — Progress Notes (Signed)
Subjective:     Patient ID: Angela Shelton, female   DOB: [birth year], 25 y.o.   MRN: [id number]  HPI Seen last week for yeast infection- took diflucan and symptoms worse, not better- vaginal burning and itching, and slight brown d/c.  Review of Systems See above plus reports worsening depression since delivery: [facility] 03/05/2016  Decreased Interest 3  Down, Depressed, Hopeless 3  PHQ - 2 Score 6  Altered sleeping 3  Tired, decreased energy 3  Change in appetite 3  Feeling bad or failure about yourself  3  Trouble concentrating 2  Moving slowly or fidgety/restless 1  Suicidal thoughts 0  PHQ-9 Score 21  Difficult doing work/chores Extremely dIfficult   States infant is colickly and she is not sleeping much, really aggitated with spouse and the situation and cries every day.     Objective:   Physical Exam A&O x4 Blood pressure 140/88, pulse 84, weight 159 lb (72.122 kg),  currently breastfeeding. Pelvic exam: VULVA: normal appearing vulva with no masses, tenderness or lesions, vulvar erythema bilaterally, VAGINA: atrophic, CERVIX: cervical discharge present - dark and thick, WET MOUNT done - results: negative for pathogens, normal epithelial cells, lactobacilli +, RBC+.    Assessment:     Vaginal yeast infection postpartum depression     Plan:     terazol 7 per directions Lexapro 10mg  daily, will increase to 20mg  next week at [facility], counseled at length about PPD and treatment,  Also will place Mirena next weeks visit.  Melody AsotinShambley, CNM

## 2016-03-05 NOTE — Patient Instructions (Signed)
Postpartum Depression and Baby Blues °The postpartum period begins right after the birth of a baby. During this time, there is often a great amount of joy and excitement. It is also a time of many changes in the life of the parents. Regardless of how many times a mother gives birth, each child brings new challenges and dynamics to the family. It is not unusual to have feelings of excitement along with confusing shifts in moods, emotions, and thoughts. All mothers are at risk of developing postpartum depression or the "baby blues." These mood changes can occur right after giving birth, or they may occur many months after giving birth. The baby blues or postpartum depression can be mild or severe. Additionally, postpartum depression can go away rather quickly, or it can be a long-term condition.  °CAUSES °Raised hormone levels and the rapid drop in those levels are thought to be a main cause of postpartum depression and the baby blues. A number of hormones change during and after pregnancy. Estrogen and progesterone usually decrease right after the delivery of your baby. The levels of thyroid hormone and various cortisol steroids also rapidly drop. Other factors that play a role in these mood changes include major life events and genetics.  °RISK FACTORS °If you have any of the following risks for the baby blues or postpartum depression, know what symptoms to watch out for during the postpartum period. Risk factors that may increase the likelihood of getting the baby blues or postpartum depression include: °· Having a personal or family history of depression.   °· Having depression while being pregnant.   °· Having premenstrual mood issues or mood issues related to oral contraceptives. °· Having a lot of life stress.   °· Having marital conflict.   °· Lacking a social support network.   °· Having a baby with special needs.   °· Having health problems, such as diabetes.   °SIGNS AND SYMPTOMS °Symptoms of baby blues  include: °· Brief changes in mood, such as going from extreme happiness to sadness. °· Decreased concentration.   °· Difficulty sleeping.   °· Crying spells, tearfulness.   °· Irritability.   °· Anxiety.   °Symptoms of postpartum depression typically begin within the first month after giving birth. These symptoms include: °· Difficulty sleeping or excessive sleepiness.   °· Marked weight loss.   °· Agitation.   °· Feelings of worthlessness.   °· Lack of interest in activity or food.   °Postpartum psychosis is a very serious condition and can be dangerous. Fortunately, it is rare. Displaying any of the following symptoms is cause for immediate medical attention. Symptoms of postpartum psychosis include:  °· Hallucinations and delusions.   °· Bizarre or disorganized behavior.   °· Confusion or disorientation.   °DIAGNOSIS  °A diagnosis is made by an evaluation of your symptoms. There are no medical or lab tests that lead to a diagnosis, but there are various questionnaires that a health care provider may use to identify those with the baby blues, postpartum depression, or psychosis. Often, a screening tool called the Edinburgh Postnatal Depression Scale is used to diagnose depression in the postpartum period.  °TREATMENT °The baby blues usually goes away on its own in 1-2 weeks. Social support is often all that is needed. You will be encouraged to get adequate sleep and rest. Occasionally, you may be given medicines to help you sleep.  °Postpartum depression requires treatment because it can last several months or longer if it is not treated. Treatment may include individual or group therapy, medicine, or both to address any social, physiological, and psychological   factors that may play a role in the depression. Regular exercise, a healthy diet, rest, and social support may also be strongly recommended.  °Postpartum psychosis is more serious and needs treatment right away. Hospitalization is often needed. °HOME CARE  INSTRUCTIONS °· Get as much rest as you can. Nap when the baby sleeps.   °· Exercise regularly. Some women find yoga and walking to be beneficial.   °· Eat a balanced and nourishing diet.   °· Do little things that you enjoy. Have a cup of tea, take a bubble bath, read your favorite magazine, or listen to your favorite music. °· Avoid alcohol.   °· Ask for help with household chores, cooking, grocery shopping, or running errands as needed. Do not try to do everything.   °· Talk to people close to you about how you are feeling. Get support from your partner, family members, friends, or other new moms. °· Try to stay positive in how you think. Think about the things you are grateful for.   °· Do not spend a lot of time alone.   °· Only take over-the-counter or prescription medicine as directed by your health care provider. °· Keep all your postpartum appointments.   °· Let your health care provider know if you have any concerns.   °SEEK MEDICAL CARE IF: °You are having a reaction to or problems with your medicine. °SEEK IMMEDIATE MEDICAL CARE IF: °· You have suicidal feelings.   °· You think you may harm the baby or someone else. °MAKE SURE YOU: °· Understand these instructions. °· Will watch your condition. °· Will get help right away if you are not doing well or get worse. °  °This information is not intended to replace advice given to you by your health care provider. Make sure you discuss any questions you have with your health care provider. °  °Document Released: 09/12/2004 Document Revised: 12/14/2013 Document Reviewed: 09/20/2013 °Elsevier Interactive Patient Education ©2016 [employer] Inc. ° °

## 2016-03-13 ENCOUNTER — Encounter: Payer: Self-pay | Admitting: Obstetrics and Gynecology

## 2016-03-13 ENCOUNTER — Ambulatory Visit (INDEPENDENT_AMBULATORY_CARE_PROVIDER_SITE_OTHER): Payer: BLUE CROSS/BLUE SHIELD | Admitting: Obstetrics and Gynecology

## 2016-03-13 DIAGNOSIS — Z30430 Encounter for insertion of intrauterine contraceptive device: Secondary | ICD-10-CM

## 2016-03-13 MED ORDER — LEVONORGESTREL 20 MCG/24HR IU IUD: 1.0000 | INTRAUTERINE_SYSTEM | Freq: Once | INTRAUTERINE | Status: DC

## 2016-03-13 MED ORDER — ESCITALOPRAM OXALATE 10 MG PO TABS: 10.0000 mg | ORAL_TABLET | Freq: Every day | ORAL | Status: DC

## 2016-03-13 NOTE — Patient Instructions (Signed)
  Place postpartum visit patient instructions here.  

## 2016-03-13 NOTE — Progress Notes (Signed)
  Subjective:     Angela Shelton is a 25 y.o. female who presents for a postpartum visit. She is 6 weeks postpartum following a spontaneous vaginal delivery. I have fully reviewed the prenatal and intrapartum course. The delivery was at 39 gestational weeks. Outcome: spontaneous vaginal delivery. Anesthesia: epidural. Postpartum course has been complicated by depression and anxiety . Baby's course has been complicated by reflux and slow weight gain. Baby is feeding by breast. Bleeding no bleeding. Bowel function is normal. Bladder function is normal. Patient is not sexually active. Contraception method is abstinence. Postpartum depression screening: positive but improved greatly since starting SSRI 2 weeks ago. Denies any crying spells or sadness, just still not sleeping well.  The following portions of the patient's history were reviewed and updated as appropriate: allergies, current medications, past family history, past medical history, past social history, past surgical history and problem list.  Review of Systems Pertinent items noted in HPI and remainder of comprehensive ROS otherwise negative.   Objective:    BP 116/73 mmHg  Pulse 71  Ht 5\' 4"  (1.626 m)  Wt 156 lb 8 oz (70.988 kg)  BMI 26.85 kg/m2  Breastfeeding? Yes  General:  alert, cooperative and appears stated age   Breasts:  not examined  Lungs: clear to auscultation bilaterally  Heart:  regular rate and rhythm, S1, S2 normal, no murmur, click, rub or gallop  Abdomen: soft, non-tender; bowel sounds normal; no masses,  no organomegaly   Vulva:  normal  Vagina: normal vagina, no discharge, exudate, lesion, or erythema  Cervix:  multiparous appearance  Corpus: normal size, contour, position, consistency, mobility, non-tender  Adnexa:  no mass, fullness, tenderness  Rectal Exam: Not performed.        Assessment:     6 weeks postpartum exam. Pap smear not done at today's visit.   Plan:    1. Contraception: IUD- Mirena placed  today per protocol without difficulty 2. Continue Lexapro at current dose 3. Follow up in: 4 months for AE or as needed.

## 2016-03-13 NOTE — Progress Notes (Signed)
Angela CapesJesi L Shelton is a 25 y.o. year old [id number] Caucasian female who presents for placement of a Mirena IUD.  No LMP recorded. BP 116/73 mmHg  Pulse 71  Ht 5\' 4"  (1.626 m)  Wt 156 lb 8 oz (70.988 kg)  BMI 26.85 kg/m2  Breastfeeding? Yes Last sexual intercourse was > 6 weeks ago before vaginal delivery of infant, and pregnancy test today was negative  The risks and benefits of the method and placement have been thouroughly reviewed with the patient and all questions were answered.  Specifically the patient is aware of failure rate of 12/998, expulsion of the IUD and of possible perforation.  The patient is aware of irregular bleeding due to the method and understands the incidence of irregular bleeding diminishes with time.  Signed copy of informed consent in chart.   Time out was performed.  A graves speculum was placed in the vagina.  The cervix was visualized, prepped using Betadine, and grasped with a single tooth tenaculum. The uterus was found to be retroflexed and it sounded to 8 cm.  Mirena IUD placed per manufacturer's recommendations.   The strings were trimmed to 3 cm.  The patient was given post procedure instructions, including signs and symptoms of infection and to check for the strings after each menses or each month, and refraining from intercourse or anything in the vagina for 3 days.  She was given a Mirena care card with date Mirena placed, and date Mirena to be removed.    [name] Suzan NailerN Jaaziah Schulke, CNM

## 2016-03-22 ENCOUNTER — Encounter: Payer: Self-pay | Admitting: Obstetrics and Gynecology

## 2016-04-29 ENCOUNTER — Encounter: Payer: Self-pay | Admitting: Obstetrics and Gynecology

## 2016-04-30 ENCOUNTER — Telehealth: Payer: Self-pay | Admitting: Obstetrics and Gynecology

## 2016-04-30 NOTE — Telephone Encounter (Signed)
Angela Shelton bled the whole 6 Wk after birth then got IUD and bled 4 more wks, stopped and had sex for 1st time and it hurt really bad and now she is bleeding bright red again.

## 2016-04-30 NOTE — Telephone Encounter (Signed)
Please have her come in for ultrasound to check placement, and will go from there. I do not have to see her same day.

## 2016-04-30 NOTE — Telephone Encounter (Signed)
pls advise

## 2016-05-01 ENCOUNTER — Other Ambulatory Visit: Payer: Self-pay | Admitting: Obstetrics and Gynecology

## 2016-05-01 DIAGNOSIS — Z30431 Encounter for routine checking of intrauterine contraceptive device: Secondary | ICD-10-CM

## 2016-05-01 NOTE — Telephone Encounter (Signed)
Pt is going to call and set up appt for [place]

## 2016-05-07 ENCOUNTER — Ambulatory Visit (INDEPENDENT_AMBULATORY_CARE_PROVIDER_SITE_OTHER): Payer: BLUE CROSS/BLUE SHIELD

## 2016-05-07 DIAGNOSIS — Z30431 Encounter for routine checking of intrauterine contraceptive device: Secondary | ICD-10-CM | POA: Diagnosis not present

## 2016-05-08 ENCOUNTER — Telehealth: Payer: Self-pay | Admitting: Obstetrics and Gynecology

## 2016-05-08 NOTE — Telephone Encounter (Signed)
[name] called asking what her US results were from yesterday. She'd like a phone call regarding this.  Pt's ph# [phone] Thank you.

## 2016-05-08 NOTE — Telephone Encounter (Signed)
pls advise

## 2016-05-09 NOTE — Telephone Encounter (Signed)
Have her come in for IUD removal as it is not in the correct place

## 2016-05-10 ENCOUNTER — Ambulatory Visit (INDEPENDENT_AMBULATORY_CARE_PROVIDER_SITE_OTHER): Payer: BLUE CROSS/BLUE SHIELD | Admitting: Obstetrics and Gynecology

## 2016-05-10 ENCOUNTER — Encounter: Payer: Self-pay | Admitting: Obstetrics and Gynecology

## 2016-05-10 VITALS — BP 115/84 | HR 86 | Wt 158.5 lb

## 2016-05-10 DIAGNOSIS — F53 Postpartum depression: Secondary | ICD-10-CM

## 2016-05-10 DIAGNOSIS — T8332XS Displacement of intrauterine contraceptive device, sequela: Secondary | ICD-10-CM | POA: Diagnosis not present

## 2016-05-10 DIAGNOSIS — O99345 Other mental disorders complicating the puerperium: Secondary | ICD-10-CM

## 2016-05-10 MED ORDER — NORETHINDRONE 0.35 MG PO TABS: 1.0000 | ORAL_TABLET | Freq: Every day | ORAL | Status: DC

## 2016-05-10 MED ORDER — FLUOXETINE HCL 10 MG PO CAPS: 10.0000 mg | ORAL_CAPSULE | Freq: Every day | ORAL | Status: DC

## 2016-05-10 NOTE — Progress Notes (Signed)
Patient ID: [name], female   DOB: [birth year], 25 y.o.   MRN: [id number] S: patient continued to have pain with sex and irregular bleeding since IUD inserion 2 months ago, came in for ultrasound and the following was noted:  Indications:IUD placement Findings:  The uterus measures 7 x 3.6 x 5.1. Echo texture is homogenous without evidence of focal masses.  The Endometrium measures 2.8 mm. IUD does not Appear to be within the Endometrium.  Right Ovary measures 1.7 x 2.2 x 1.3 cm. It is normal in appearance. Left Ovary measures 2.4 x 1.2 x 1.6 cm. It is normal appearance. Survey of the adnexa demonstrates no adnexal masses. There is no free fluid in the cul de sac.  Impression: 1. IUD does not appear to be in the correct location.  Also reports not taking Lexapro daily as she cannot sleep when she takes it. Still feels depressed some days.  O: A&O x4  well groomed female in no current distress Blood pressure 115/84, pulse 86, weight 158 lb 8 oz (71.895 kg), currently breastfeeding. Pelvic exam: normal external genitalia, vulva, vagina, cervix, uterus and adnexa, IUD in cervix- removed without difficulty.  A: malposition of IUD- removed Depression- unwanted side-effects with current SSRI  P: switch to Camila OCP while breastfeeding, to use backup method for first month. Switched to prozac 10mg - new rx sent in. RTC as planned for Annual.  Melody Shambley, CNM.

## 2016-05-10 NOTE — Telephone Encounter (Signed)
Pt came in 05/10/16

## 2016-07-09 ENCOUNTER — Encounter: Payer: BLUE CROSS/BLUE SHIELD | Admitting: Obstetrics and Gynecology

## 2016-09-05 ENCOUNTER — Encounter: Payer: Self-pay | Admitting: Obstetrics and Gynecology

## 2016-09-05 ENCOUNTER — Other Ambulatory Visit: Payer: Self-pay | Admitting: Obstetrics and Gynecology

## 2016-09-05 ENCOUNTER — Ambulatory Visit (INDEPENDENT_AMBULATORY_CARE_PROVIDER_SITE_OTHER): Payer: BLUE CROSS/BLUE SHIELD | Admitting: Obstetrics and Gynecology

## 2016-09-05 VITALS — BP 128/84 | HR 98 | Ht 65.0 in | Wt 158.5 lb

## 2016-09-05 DIAGNOSIS — Z01419 Encounter for gynecological examination (general) (routine) without abnormal findings: Secondary | ICD-10-CM

## 2016-09-05 DIAGNOSIS — E663 Overweight: Secondary | ICD-10-CM | POA: Diagnosis not present

## 2016-09-05 DIAGNOSIS — B379 Candidiasis, unspecified: Secondary | ICD-10-CM

## 2016-09-05 MED ORDER — TERCONAZOLE 0.4 % VA CREA: 1.0000 | TOPICAL_CREAM | Freq: Every day | VAGINAL | 2 refills | Status: DC

## 2016-09-05 NOTE — Progress Notes (Signed)
   Subjective:     Angela Shelton is a 25 y.o. female and is here for a comprehensive physical exam. The patient reports 1-2 yeast infections a months, last one 2 weeks ago- reports itching.  Social History   Social History  . Marital status: Married    Spouse name: N/A  . Number of children: N/A  . Years of education: N/A   Occupational History  . Not on file.   Social History Main Topics  . Smoking status: Never Smoker  . Smokeless tobacco: Never Used  . Alcohol use No  . Drug use: No  . Sexual activity: Yes    Birth control/ protection: None   Other Topics Concern  . Not on file   Social History Narrative  . No narrative on file   Health Maintenance  Topic Date Due  . INFLUENZA VACCINE  07/23/2016  . PAP SMEAR  07/20/2018  . TETANUS/TDAP  11/02/2025  . HIV Screening  Completed    The following portions of the patient's history were reviewed and updated as appropriate: allergies, current medications, past family history, past medical history, past social history, past surgical history and problem list.  Review of Systems Pertinent items noted in HPI and remainder of comprehensive ROS otherwise negative.   Objective:    General appearance: alert, cooperative and appears stated age Neck: no adenopathy, no carotid bruit, no JVD, supple, symmetrical, trachea midline and thyroid not enlarged, symmetric, no tenderness/mass/nodules Breasts: normal appearance, no masses or tenderness Heart: regular rate and rhythm, S1, S2 normal, no murmur, click, rub or gallop Abdomen: soft, non-tender; bowel sounds normal; no masses,  no organomegaly Pelvic: cervix normal in appearance, external genitalia normal, no adnexal masses or tenderness, no cervical motion tenderness, rectovaginal septum normal, uterus normal size, shape, and consistency, vagina normal without discharge and scant dark d/c   Microscopic wet-mount exam shows negative for pathogens, normal epithelial cells,  lactobacilli. Assessment:    Healthy female exam. Recurrent yeast infection, overweight      Plan:  Rx for terazole sent in RTC 1 year or as needed Encouraged exercise and weight loss.  Melody Aura CampsShambley, CNM   See After Visit Summary for Counseling Recommendations

## 2016-09-05 NOTE — Patient Instructions (Signed)
Preventive Care for Adults, Female A healthy lifestyle and preventive care can promote health and wellness. Preventive health guidelines for women include the following key practices.  A routine yearly physical is a good way to check with your health care provider about your health and preventive screening. It is a chance to share any concerns and updates on your health and to receive a thorough exam.  Visit your dentist for a routine exam and preventive care every 6 months. Brush your teeth twice a day and floss once a day. Good oral hygiene prevents tooth decay and gum disease.  The frequency of eye exams is based on your age, health, family medical history, use of contact lenses, and other factors. Follow your health care provider's recommendations for frequency of eye exams.  Eat a healthy diet. Foods like vegetables, fruits, whole grains, low-fat dairy products, and lean protein foods contain the nutrients you need without too many calories. Decrease your intake of foods high in solid fats, added sugars, and salt. Eat the right amount of calories for you.Get information about a proper diet from your health care provider, if necessary.  Regular physical exercise is one of the most important things you can do for your health. Most adults should get at least 150 minutes of moderate-intensity exercise (any activity that increases your heart rate and causes you to sweat) each week. In addition, most adults need muscle-strengthening exercises on 2 or more days a week.  Maintain a healthy weight. The body mass index (BMI) is a screening tool to identify possible weight problems. It provides an estimate of body fat based on height and weight. Your health care provider can find your BMI and can help you achieve or maintain a healthy weight.For adults 20 years and older:  A BMI below 18.5 is considered underweight.  A BMI of 18.5 to 24.9 is normal.  A BMI of 25 to 29.9 is considered  overweight.  A BMI of 30 and above is considered obese.  Maintain normal blood lipids and cholesterol levels by exercising and minimizing your intake of saturated fat. Eat a balanced diet with plenty of fruit and vegetables. Blood tests for lipids and cholesterol should begin at age 64 and be repeated every 5 years. If your lipid or cholesterol levels are high, you are over 50, or you are at high risk for heart disease, you may need your cholesterol levels checked more frequently.Ongoing high lipid and cholesterol levels should be treated with medicines if diet and exercise are not working.  If you smoke, find out from your health care provider how to quit. If you do not use tobacco, do not start.  Lung cancer screening is recommended for adults aged 52-80 years who are at high risk for developing lung cancer because of a history of smoking. A yearly low-dose CT scan of the lungs is recommended for people who have at least a 30-pack-year history of smoking and are a current smoker or have quit within the past 15 years. A pack year of smoking is smoking an average of 1 pack of cigarettes a day for 1 year (for example: 1 pack a day for 30 years or 2 packs a day for 15 years). Yearly screening should continue until the smoker has stopped smoking for at least 15 years. Yearly screening should be stopped for people who develop a health problem that would prevent them from having lung cancer treatment.  If you are pregnant, do not drink alcohol. If you are  breastfeeding, be very cautious about drinking alcohol. If you are not pregnant and choose to drink alcohol, do not have more than 1 drink per day. One drink is considered to be 12 ounces (355 mL) of beer, 5 ounces (148 mL) of wine, or 1.5 ounces (44 mL) of liquor.  Avoid use of street drugs. Do not share needles with anyone. Ask for help if you need support or instructions about stopping the use of drugs.  High blood pressure causes heart disease and  increases the risk of stroke. Your blood pressure should be checked at least every 1 to 2 years. Ongoing high blood pressure should be treated with medicines if weight loss and exercise do not work.  If you are 25-78 years old, ask your health care provider if you should take aspirin to prevent strokes.  Diabetes screening is done by taking a blood sample to check your blood glucose level after you have not eaten for a certain period of time (fasting). If you are not overweight and you do not have risk factors for diabetes, you should be screened once every 3 years starting at age 86. If you are overweight or obese and you are 3-87 years of age, you should be screened for diabetes every year as part of your cardiovascular risk assessment.  Breast cancer screening is essential preventive care for women. You should practice "breast self-awareness." This means understanding the normal appearance and feel of your breasts and may include breast self-examination. Any changes detected, no matter how small, should be reported to a health care provider. Women in their 66s and 30s should have a clinical breast exam (CBE) by a health care provider as part of a regular health exam every 1 to 3 years. After age 43, women should have a CBE every year. Starting at age 37, women should consider having a mammogram (breast X-ray test) every year. Women who have a family history of breast cancer should talk to their health care provider about genetic screening. Women at a high risk of breast cancer should talk to their health care providers about having an MRI and a mammogram every year.  Breast cancer gene (BRCA)-related cancer risk assessment is recommended for women who have family members with BRCA-related cancers. BRCA-related cancers include breast, ovarian, tubal, and peritoneal cancers. Having family members with these cancers may be associated with an increased risk for harmful changes (mutations) in the breast  cancer genes BRCA1 and BRCA2. Results of the assessment will determine the need for genetic counseling and BRCA1 and BRCA2 testing.  Your health care provider may recommend that you be screened regularly for cancer of the pelvic organs (ovaries, uterus, and vagina). This screening involves a pelvic examination, including checking for microscopic changes to the surface of your cervix (Pap test). You may be encouraged to have this screening done every 3 years, beginning at age 78.  For women ages 79-65, health care providers may recommend pelvic exams and Pap testing every 3 years, or they may recommend the Pap and pelvic exam, combined with testing for human papilloma virus (HPV), every 5 years. Some types of HPV increase your risk of cervical cancer. Testing for HPV may also be done on women of any age with unclear Pap test results.  Other health care providers may not recommend any screening for nonpregnant women who are considered low risk for pelvic cancer and who do not have symptoms. Ask your health care provider if a screening pelvic exam is right for  you.  If you have had past treatment for cervical cancer or a condition that could lead to cancer, you need Pap tests and screening for cancer for at least 20 years after your treatment. If Pap tests have been discontinued, your risk factors (such as having a new sexual partner) need to be reassessed to determine if screening should resume. Some women have medical problems that increase the chance of getting cervical cancer. In these cases, your health care provider may recommend more frequent screening and Pap tests.  Colorectal cancer can be detected and often prevented. Most routine colorectal cancer screening begins at the age of 50 years and continues through age 75 years. However, your health care provider may recommend screening at an earlier age if you have risk factors for colon cancer. On a yearly basis, your health care provider may provide  home test kits to check for hidden blood in the stool. Use of a small camera at the end of a tube, to directly examine the colon (sigmoidoscopy or colonoscopy), can detect the earliest forms of colorectal cancer. Talk to your health care provider about this at age 50, when routine screening begins. Direct exam of the colon should be repeated every 5-10 years through age 75 years, unless early forms of precancerous polyps or small growths are found.  People who are at an increased risk for hepatitis B should be screened for this virus. You are considered at high risk for hepatitis B if:  You were born in a country where hepatitis B occurs often. Talk with your health care provider about which countries are considered high risk.  Your parents were born in a high-risk country and you have not received a shot to protect against hepatitis B (hepatitis B vaccine).  You have HIV or AIDS.  You use needles to inject street drugs.  You live with, or have sex with, someone who has hepatitis B.  You get hemodialysis treatment.  You take certain medicines for conditions like cancer, organ transplantation, and autoimmune conditions.  Hepatitis C blood testing is recommended for all people born from 1945 through 1965 and any individual with known risks for hepatitis C.  Practice safe sex. Use condoms and avoid high-risk sexual practices to reduce the spread of sexually transmitted infections (STIs). STIs include gonorrhea, chlamydia, syphilis, trichomonas, herpes, HPV, and human immunodeficiency virus (HIV). Herpes, HIV, and HPV are viral illnesses that have no cure. They can result in disability, cancer, and death.  You should be screened for sexually transmitted illnesses (STIs) including gonorrhea and chlamydia if:  You are sexually active and are younger than 24 years.  You are older than 24 years and your health care provider tells you that you are at risk for this type of infection.  Your sexual  activity has changed since you were last screened and you are at an increased risk for chlamydia or gonorrhea. Ask your health care provider if you are at risk.  If you are at risk of being infected with HIV, it is recommended that you take a prescription medicine daily to prevent HIV infection. This is called preexposure prophylaxis (PrEP). You are considered at risk if:  You are sexually active and do not regularly use condoms or know the HIV status of your partner(s).  You take drugs by injection.  You are sexually active with a partner who has HIV.  Talk with your health care provider about whether you are at high risk of being infected with HIV. If   you choose to begin PrEP, you should first be tested for HIV. You should then be tested every 3 months for as long as you are taking PrEP.  Osteoporosis is a disease in which the bones lose minerals and strength with aging. This can result in serious bone fractures or breaks. The risk of osteoporosis can be identified using a bone density scan. Women ages 1 years and over and women at risk for fractures or osteoporosis should discuss screening with their health care providers. Ask your health care provider whether you should take a calcium supplement or vitamin D to reduce the rate of osteoporosis.  Menopause can be associated with physical symptoms and risks. Hormone replacement therapy is available to decrease symptoms and risks. You should talk to your health care provider about whether hormone replacement therapy is right for you.  Use sunscreen. Apply sunscreen liberally and repeatedly throughout the day. You should seek shade when your shadow is shorter than you. Protect yourself by wearing long sleeves, pants, a wide-brimmed hat, and sunglasses year round, whenever you are outdoors.  Once a month, do a whole body skin exam, using a mirror to look at the skin on your back. Tell your health care provider of new moles, moles that have irregular  borders, moles that are larger than a pencil eraser, or moles that have changed in shape or color.  Stay current with required vaccines (immunizations).  Influenza vaccine. All adults should be immunized every year.  Tetanus, diphtheria, and acellular pertussis (Td, Tdap) vaccine. Pregnant women should receive 1 dose of Tdap vaccine during each pregnancy. The dose should be obtained regardless of the length of time since the last dose. Immunization is preferred during the 27th-36th week of gestation. An adult who has not previously received Tdap or who does not know her vaccine status should receive 1 dose of Tdap. This initial dose should be followed by tetanus and diphtheria toxoids (Td) booster doses every 10 years. Adults with an unknown or incomplete history of completing a 3-dose immunization series with Td-containing vaccines should begin or complete a primary immunization series including a Tdap dose. Adults should receive a Td booster every 10 years.  Varicella vaccine. An adult without evidence of immunity to varicella should receive 2 doses or a second dose if she has previously received 1 dose. Pregnant females who do not have evidence of immunity should receive the first dose after pregnancy. This first dose should be obtained before leaving the health care facility. The second dose should be obtained 4-8 weeks after the first dose.  Human papillomavirus (HPV) vaccine. Females aged 13-26 years who have not received the vaccine previously should obtain the 3-dose series. The vaccine is not recommended for use in pregnant females. However, pregnancy testing is not needed before receiving a dose. If a female is found to be pregnant after receiving a dose, no treatment is needed. In that case, the remaining doses should be delayed until after the pregnancy. Immunization is recommended for any person with an immunocompromised condition through the age of 24 years if she did not get any or all doses  earlier. During the 3-dose series, the second dose should be obtained 4-8 weeks after the first dose. The third dose should be obtained 24 weeks after the first dose and 16 weeks after the second dose.  Zoster vaccine. One dose is recommended for adults aged 97 years or older unless certain conditions are present.  Measles, mumps, and rubella (MMR) vaccine. Adults born  before 1957 generally are considered immune to measles and mumps. Adults born in [birth year] or later should have 1 or more doses of MMR vaccine unless there is a contraindication to the vaccine or there is laboratory evidence of immunity to each of the three diseases. A routine second dose of MMR vaccine should be obtained at least 28 days after the first dose for students attending postsecondary schools, health care workers, or international travelers. People who received inactivated measles vaccine or an unknown type of measles vaccine during 1963-1967 should receive 2 doses of MMR vaccine. People who received inactivated mumps vaccine or an unknown type of mumps vaccine before 1979 and are at high risk for mumps infection should consider immunization with 2 doses of MMR vaccine. For females of childbearing age, rubella immunity should be determined. If there is no evidence of immunity, females who are not pregnant should be vaccinated. If there is no evidence of immunity, females who are pregnant should delay immunization until after pregnancy. Unvaccinated health care workers born before 60 who lack laboratory evidence of measles, mumps, or rubella immunity or laboratory confirmation of disease should consider measles and mumps immunization with 2 doses of MMR vaccine or rubella immunization with 1 dose of MMR vaccine.  Pneumococcal 13-valent conjugate (PCV13) vaccine. When indicated, a person who is uncertain of his immunization history and has no record of immunization should receive the PCV13 vaccine. All adults 61 years of age and older  should receive this vaccine. An adult aged 92 years or older who has certain medical conditions and has not been previously immunized should receive 1 dose of PCV13 vaccine. This PCV13 should be followed with a dose of pneumococcal polysaccharide (PPSV23) vaccine. Adults who are at high risk for pneumococcal disease should obtain the PPSV23 vaccine at least 8 weeks after the dose of PCV13 vaccine. Adults older than 23 years of age who have normal immune system function should obtain the PPSV23 vaccine dose at least 1 year after the dose of PCV13 vaccine.  Pneumococcal polysaccharide (PPSV23) vaccine. When PCV13 is also indicated, PCV13 should be obtained first. All adults aged 2 years and older should be immunized. An adult younger than age 30 years who has certain medical conditions should be immunized. Any person who resides in a nursing home or long-term care facility should be immunized. An adult smoker should be immunized. People with an immunocompromised condition and certain other conditions should receive both PCV13 and PPSV23 vaccines. People with human immunodeficiency virus (HIV) infection should be immunized as soon as possible after diagnosis. Immunization during chemotherapy or radiation therapy should be avoided. Routine use of PPSV23 vaccine is not recommended for American Indians, [place] Natives, or people younger than 65 years unless there are medical conditions that require PPSV23 vaccine. When indicated, people who have unknown immunization and have no record of immunization should receive PPSV23 vaccine. One-time revaccination 5 years after the first dose of PPSV23 is recommended for people aged 19-64 years who have chronic kidney failure, nephrotic syndrome, asplenia, or immunocompromised conditions. People who received 1-2 doses of PPSV23 before age 44 years should receive another dose of PPSV23 vaccine at age 83 years or later if at least 5 years have passed since the previous dose. Doses  of PPSV23 are not needed for people immunized with PPSV23 at or after age 20 years.  Meningococcal vaccine. Adults with asplenia or persistent complement component deficiencies should receive 2 doses of quadrivalent meningococcal conjugate (MenACWY-D) vaccine. The doses should be obtained  at least 2 months apart. Microbiologists working with certain meningococcal bacteria, [place] recruits, people at risk during an outbreak, and people who travel to or live in countries with a high rate of meningitis should be immunized. A first-year college student up through age 28 years who is living in a residence hall should receive a dose if she did not receive a dose on or after her 16th birthday. Adults who have certain high-risk conditions should receive one or more doses of vaccine.  Hepatitis A vaccine. Adults who wish to be protected from this disease, have certain high-risk conditions, work with hepatitis A-infected animals, work in hepatitis A research labs, or travel to or work in countries with a high rate of hepatitis A should be immunized. Adults who were previously unvaccinated and who anticipate close contact with an international adoptee during the first 60 days after arrival in the [place] from a country with a high rate of hepatitis A should be immunized.  Hepatitis B vaccine. Adults who wish to be protected from this disease, have certain high-risk conditions, may be exposed to blood or other infectious body fluids, are household contacts or sex partners of hepatitis B positive people, are clients or workers in certain care facilities, or travel to or work in countries with a high rate of hepatitis B should be immunized.  Haemophilus influenzae type b (Hib) vaccine. A previously unvaccinated person with asplenia or sickle cell disease or having a scheduled splenectomy should receive 1 dose of Hib vaccine. Regardless of previous immunization, a recipient of a hematopoietic stem cell transplant  should receive a 3-dose series 6-12 months after her successful transplant. Hib vaccine is not recommended for adults with HIV infection. Preventive Services / Frequency Ages 71 to 87 years  Blood pressure check.** / Every 3-5 years.  Lipid and cholesterol check.** / Every 5 years beginning at age 1.  Clinical breast exam.** / Every 3 years for women in their 3s and 31s.  BRCA-related cancer risk assessment.** / For women who have family members with a BRCA-related cancer (breast, ovarian, tubal, or peritoneal cancers).  Pap test.** / Every 2 years from ages 50 through 86. Every 3 years starting at age 87 through age 7 or 75 with a history of 3 consecutive normal Pap tests.  HPV screening.** / Every 3 years from ages 59 through ages 35 to 6 with a history of 3 consecutive normal Pap tests.  Hepatitis C blood test.** / For any individual with known risks for hepatitis C.  Skin self-exam. / Monthly.  Influenza vaccine. / Every year.  Tetanus, diphtheria, and acellular pertussis (Tdap, Td) vaccine.** / Consult your health care provider. Pregnant women should receive 1 dose of Tdap vaccine during each pregnancy. 1 dose of Td every 10 years.  Varicella vaccine.** / Consult your health care provider. Pregnant females who do not have evidence of immunity should receive the first dose after pregnancy.  HPV vaccine. / 3 doses over 6 months, if 72 and younger. The vaccine is not recommended for use in pregnant females. However, pregnancy testing is not needed before receiving a dose.  Measles, mumps, rubella (MMR) vaccine.** / You need at least 1 dose of MMR if you were born in 1957 or later. You may also need a 2nd dose. For females of childbearing age, rubella immunity should be determined. If there is no evidence of immunity, females who are not pregnant should be vaccinated. If there is no evidence of immunity, females who are  pregnant should delay immunization until after  pregnancy.  Pneumococcal 13-valent conjugate (PCV13) vaccine.** / Consult your health care provider.  Pneumococcal polysaccharide (PPSV23) vaccine.** / 1 to 2 doses if you smoke cigarettes or if you have certain conditions.  Meningococcal vaccine.** / 1 dose if you are age 87 to 44 years and a Market researcher living in a residence hall, or have one of several medical conditions, you need to get vaccinated against meningococcal disease. You may also need additional booster doses.  Hepatitis A vaccine.** / Consult your health care provider.  Hepatitis B vaccine.** / Consult your health care provider.  Haemophilus influenzae type b (Hib) vaccine.** / Consult your health care provider. Ages 86 to 38 years  Blood pressure check.** / Every year.  Lipid and cholesterol check.** / Every 5 years beginning at age 49 years.  Lung cancer screening. / Every year if you are aged 71-80 years and have a 30-pack-year history of smoking and currently smoke or have quit within the past 15 years. Yearly screening is stopped once you have quit smoking for at least 15 years or develop a health problem that would prevent you from having lung cancer treatment.  Clinical breast exam.** / Every year after age 51 years.  BRCA-related cancer risk assessment.** / For women who have family members with a BRCA-related cancer (breast, ovarian, tubal, or peritoneal cancers).  Mammogram.** / Every year beginning at age 18 years and continuing for as long as you are in good health. Consult with your health care provider.  Pap test.** / Every 3 years starting at age 63 years through age 37 or 57 years with a history of 3 consecutive normal Pap tests.  HPV screening.** / Every 3 years from ages 41 years through ages 76 to 23 years with a history of 3 consecutive normal Pap tests.  Fecal occult blood test (FOBT) of stool. / Every year beginning at age 36 years and continuing until age 51 years. You may not need  to do this test if you get a colonoscopy every 10 years.  Flexible sigmoidoscopy or colonoscopy.** / Every 5 years for a flexible sigmoidoscopy or every 10 years for a colonoscopy beginning at age 36 years and continuing until age 35 years.  Hepatitis C blood test.** / For all people born from [birth year] through 1965 and any individual with known risks for hepatitis C.  Skin self-exam. / Monthly.  Influenza vaccine. / Every year.  Tetanus, diphtheria, and acellular pertussis (Tdap/Td) vaccine.** / Consult your health care provider. Pregnant women should receive 1 dose of Tdap vaccine during each pregnancy. 1 dose of Td every 10 years.  Varicella vaccine.** / Consult your health care provider. Pregnant females who do not have evidence of immunity should receive the first dose after pregnancy.  Zoster vaccine.** / 1 dose for adults aged 73 years or older.  Measles, mumps, rubella (MMR) vaccine.** / You need at least 1 dose of MMR if you were born in 1957 or later. You may also need a second dose. For females of childbearing age, rubella immunity should be determined. If there is no evidence of immunity, females who are not pregnant should be vaccinated. If there is no evidence of immunity, females who are pregnant should delay immunization until after pregnancy.  Pneumococcal 13-valent conjugate (PCV13) vaccine.** / Consult your health care provider.  Pneumococcal polysaccharide (PPSV23) vaccine.** / 1 to 2 doses if you smoke cigarettes or if you have certain conditions.  Meningococcal vaccine.** /  Consult your health care provider.  Hepatitis A vaccine.** / Consult your health care provider.  Hepatitis B vaccine.** / Consult your health care provider.  Haemophilus influenzae type b (Hib) vaccine.** / Consult your health care provider. Ages 80 years and over  Blood pressure check.** / Every year.  Lipid and cholesterol check.** / Every 5 years beginning at age 62 years.  Lung cancer  screening. / Every year if you are aged 32-80 years and have a 30-pack-year history of smoking and currently smoke or have quit within the past 15 years. Yearly screening is stopped once you have quit smoking for at least 15 years or develop a health problem that would prevent you from having lung cancer treatment.  Clinical breast exam.** / Every year after age 61 years.  BRCA-related cancer risk assessment.** / For women who have family members with a BRCA-related cancer (breast, ovarian, tubal, or peritoneal cancers).  Mammogram.** / Every year beginning at age 39 years and continuing for as long as you are in good health. Consult with your health care provider.  Pap test.** / Every 3 years starting at age 85 years through age 74 or 72 years with 3 consecutive normal Pap tests. Testing can be stopped between 65 and 70 years with 3 consecutive normal Pap tests and no abnormal Pap or HPV tests in the past 10 years.  HPV screening.** / Every 3 years from ages 55 years through ages 67 or 77 years with a history of 3 consecutive normal Pap tests. Testing can be stopped between 65 and 70 years with 3 consecutive normal Pap tests and no abnormal Pap or HPV tests in the past 10 years.  Fecal occult blood test (FOBT) of stool. / Every year beginning at age 81 years and continuing until age 22 years. You may not need to do this test if you get a colonoscopy every 10 years.  Flexible sigmoidoscopy or colonoscopy.** / Every 5 years for a flexible sigmoidoscopy or every 10 years for a colonoscopy beginning at age 67 years and continuing until age 22 years.  Hepatitis C blood test.** / For all people born from [birth year] through 1963 and any individual with known risks for hepatitis C.  Osteoporosis screening.** / A one-time screening for women ages 8 years and over and women at risk for fractures or osteoporosis.  Skin self-exam. / Monthly.  Influenza vaccine. / Every year.  Tetanus, diphtheria, and  acellular pertussis (Tdap/Td) vaccine.** / 1 dose of Td every 10 years.  Varicella vaccine.** / Consult your health care provider.  Zoster vaccine.** / 1 dose for adults aged 56 years or older.  Pneumococcal 13-valent conjugate (PCV13) vaccine.** / Consult your health care provider.  Pneumococcal polysaccharide (PPSV23) vaccine.** / 1 dose for all adults aged 15 years and older.  Meningococcal vaccine.** / Consult your health care provider.  Hepatitis A vaccine.** / Consult your health care provider.  Hepatitis B vaccine.** / Consult your health care provider.  Haemophilus influenzae type b (Hib) vaccine.** / Consult your health care provider. ** Family history and personal history of risk and conditions may change your health care provider's recommendations.   This information is not intended to replace advice given to you by your health care provider. Make sure you discuss any questions you have with your health care provider.   Document Released: 02/04/2002 Document Revised: 12/30/2014 Document Reviewed: 05/06/2011 Elsevier Interactive Patient Education [employer].

## 2016-09-09 LAB — CYTOLOGY - PAP

## 2016-09-10 ENCOUNTER — Encounter: Payer: BLUE CROSS/BLUE SHIELD | Admitting: Obstetrics and Gynecology

## 2016-10-03 ENCOUNTER — Encounter: Payer: Self-pay | Admitting: Obstetrics and Gynecology

## 2016-11-19 ENCOUNTER — Other Ambulatory Visit: Payer: BLUE CROSS/BLUE SHIELD

## 2016-11-19 ENCOUNTER — Telehealth: Payer: Self-pay | Admitting: Obstetrics and Gynecology

## 2016-11-19 ENCOUNTER — Encounter: Payer: Self-pay | Admitting: Obstetrics and Gynecology

## 2016-11-19 NOTE — Telephone Encounter (Signed)
Pt called and she thinks she is having a miscarriage however she has not taken a pregnancy test to verify that she is pregnant, she stated she had her period 2 weeks ago, and last night she started bleeding and she said some long thing came out. She wanted to be seen but I informed her that [facility] was off this week. She would like a call back.

## 2016-11-19 NOTE — Telephone Encounter (Signed)
Pt is coming in and getting beta @ 1:30 11/19/16

## 2016-11-19 NOTE — Telephone Encounter (Signed)
Also to add to this message she stated that everything she has read on the Internet has told her that she is having a miscarriage.

## 2017-12-25 ENCOUNTER — Ambulatory Visit: Payer: BLUE CROSS/BLUE SHIELD | Admitting: Obstetrics and Gynecology

## 2020-05-02 ENCOUNTER — Other Ambulatory Visit: Payer: Self-pay

## 2020-05-02 ENCOUNTER — Encounter: Payer: Self-pay | Admitting: Obstetrics and Gynecology

## 2020-05-02 ENCOUNTER — Ambulatory Visit: Payer: Self-pay | Admitting: Obstetrics and Gynecology

## 2020-05-02 VITALS — BP 145/92 | HR 99 | Ht 65.0 in | Wt 201.2 lb

## 2020-05-02 DIAGNOSIS — N921 Excessive and frequent menstruation with irregular cycle: Secondary | ICD-10-CM

## 2020-05-02 DIAGNOSIS — Z975 Presence of (intrauterine) contraceptive device: Secondary | ICD-10-CM

## 2020-05-02 MED ORDER — MEDROXYPROGESTERONE ACETATE 10 MG PO TABS: 10.0000 mg | ORAL_TABLET | Freq: Every day | ORAL | 0 refills | Status: DC

## 2020-05-02 NOTE — Progress Notes (Signed)
HPI:      Angela Shelton is a 29 y.o. G1P1000 who LMP was Patient's last menstrual period was 04/09/2020.  Subjective:   She presents today stating that she has been using NuvaRing for birth control and has normal regular cycles each month.  In April she did her normal with the NuvaRing but she continued to have bleeding clotting and some clumpy vaginal discharge despite using the NuvaRing correctly.  She has had a recent pregnancy test which was negative.  She understandably would like her bleeding to stop since she is more than 2 weeks into her next NuvaRing and is still bleeding. Of significant note she needs an annual examination and Pap smear. She has tried IUD in the past and had issues with cramping and spotting so she discontinued its use.  She has also used Nexplanon in the past.    Hx: The following portions of the patient's history were reviewed and updated as appropriate:             She  has no past medical history on file. She does not have any pertinent problems on file. She  has no past surgical history on file. Her family history includes Diabetes in her sister. She  reports that she has never smoked. She has never used smokeless tobacco. She reports that she does not drink alcohol or use drugs. She has a current medication list which includes the following prescription(s): etonogestrel-ethinyl estradiol. She is allergic to penicillins.       Review of Systems:  Review of Systems  Constitutional: Denied constitutional symptoms, night sweats, recent illness, fatigue, fever, insomnia and weight loss.  Eyes: Denied eye symptoms, eye pain, photophobia, vision change and visual disturbance.  Ears/Nose/Throat/Neck: Denied ear, nose, throat or neck symptoms, hearing loss, nasal discharge, sinus congestion and sore throat.  Cardiovascular: Denied cardiovascular symptoms, arrhythmia, chest pain/pressure, edema, exercise intolerance, orthopnea and palpitations.  Respiratory: Denied  pulmonary symptoms, asthma, pleuritic pain, productive sputum, cough, dyspnea and wheezing.  Gastrointestinal: Denied, gastro-esophageal reflux, melena, nausea and vomiting.  Genitourinary: See HPI for additional information.  Musculoskeletal: Denied musculoskeletal symptoms, stiffness, swelling, muscle weakness and myalgia.  Dermatologic: Denied dermatology symptoms, rash and scar.  Neurologic: Denied neurology symptoms, dizziness, headache, neck pain and syncope.  Psychiatric: Denied psychiatric symptoms, anxiety and depression.  Endocrine: Denied endocrine symptoms including hot flashes and night sweats.   Meds:   Current Outpatient Medications on File Prior to Visit  Medication Sig Dispense Refill  . etonogestrel-ethinyl estradiol (NUVARING) 0.12-0.015 MG/24HR vaginal ring Place vaginally.     No current facility-administered medications on file prior to visit.    Objective:     Vitals:   05/02/20 1434  BP: (!) 145/92  Pulse: 99                Assessment:    G1P1000 There are no problems to display for this patient.    1. Breakthrough bleeding with NuvaRing     Patient has had 1 cycle of breakthrough bleeding on NuvaRing but otherwise has never had an issue.   Plan:            1.  We will add progesterone to this NuvaRing and attempt to stop her bleeding and then reset her cycle for the next NuvaRing.  Expectant management-likely to again have cycle control with next NuvaRing. We have briefly discussed other forms of birth control more specifically IUD and she will consider this as an option in the  future.  2.  I recommend she return for annual examination and Pap smear within the next month. Orders No orders of the defined types were placed in this encounter.   No orders of the defined types were placed in this encounter.     F/U  Return in about 1 month (around 06/02/2020) for Annual Physical. I spent 31 minutes involved in the care of this patient preparing  to see the patient by obtaining and reviewing her medical history (including labs, imaging tests and prior procedures), documenting clinical information in the electronic health record (EHR), counseling and coordinating care plans, writing and sending prescriptions, ordering tests or procedures and directly communicating with the patient by discussing pertinent items from her history and physical exam as well as detailing my assessment and plan as noted above so that she has an informed understanding.  All of her questions were answered.  Angela Shelton, M.D. 05/02/2020 3:02 PM

## 2020-05-09 ENCOUNTER — Encounter: Payer: Self-pay | Admitting: Obstetrics and Gynecology

## 2020-08-07 ENCOUNTER — Ambulatory Visit
Admission: EM | Admit: 2020-08-07 | Discharge: 2020-08-07 | Disposition: A | Discharge status: Home / Self Care | Payer: BLUE CROSS/BLUE SHIELD | Attending: Emergency Medicine | Admitting: Emergency Medicine

## 2020-08-07 ENCOUNTER — Other Ambulatory Visit: Payer: Self-pay

## 2020-08-07 DIAGNOSIS — J029 Acute pharyngitis, unspecified: Secondary | ICD-10-CM | POA: Diagnosis not present

## 2020-08-07 LAB — POCT RAPID STREP A (OFFICE): Rapid Strep A Screen: NEGATIVE

## 2020-08-07 NOTE — ED Provider Notes (Signed)
[name]    CSN: [id number] Arrival date & time: 08/07/20  6269      History   Chief Complaint Chief Complaint  Patient presents with  . Fever  . Sore Throat    HPI Angela Shelton is a 29 y.o. female.   Patient presents with fever and sore throat.  T-max 101.5; no fever since yesterday morning.  Her daughter has similar symptoms.  Patient denies rash, arthralgias, cough, shortness of breath, vomiting, diarrhea, or other symptoms.  Treatment attempted at home with Tylenol.  Patient is fully vaccinated for COVID.  The history is provided by the patient.    History reviewed. No pertinent past medical history.  There are no problems to display for this patient.   History reviewed. No pertinent surgical history.  OB History    Gravida  1   Para  1   Term  1   Preterm      AB      Living  0     SAB      TAB      Ectopic      Multiple  0   Live Births               Home Medications    Prior to Admission medications   Medication Sig Start Date End Date Taking? Authorizing Provider  etonogestrel-ethinyl estradiol (NUVARING) 0.12-0.015 MG/24HR vaginal ring Place vaginally. 10/29/19 10/28/20  [provider]  medroxyPROGESTERone (PROVERA) 10 MG tablet Take 1 tablet (10 mg total) by mouth daily for 12 days. 05/02/20 05/14/20  Linzie Collin, MD    Family History Family History  Problem Relation Age of Onset  . Diabetes Sister     Social History Social History   Tobacco Use  . Smoking status: Never Smoker  . Smokeless tobacco: Never Used  Substance Use Topics  . Alcohol use: No  . Drug use: No     Allergies   Penicillins   Review of Systems Review of Systems  Constitutional: Positive for fever. Negative for chills.  HENT: Positive for sore throat. Negative for congestion and ear pain.   Eyes: Negative for pain and visual disturbance.  Respiratory: Negative for cough and shortness of breath.   Cardiovascular:  Negative for chest pain and palpitations.  Gastrointestinal: Negative for abdominal pain, diarrhea and vomiting.  Genitourinary: Negative for dysuria and hematuria.  Musculoskeletal: Negative for arthralgias and back pain.  Skin: Negative for color change and rash.  Neurological: Negative for seizures and syncope.  All other systems reviewed and are negative.    Physical Exam Triage Vital Signs ED Triage Vitals  Enc Vitals Group     BP      Pulse      Resp      Temp      Temp src      SpO2      Weight      Height      Head Circumference      Peak Flow      Pain Score      Pain Loc      Pain Edu?      Excl. in GC?    No data found.  Updated Vital Signs BP (!) 127/91   Pulse (!) 106   Temp 99 F (37.2 C)   Resp 17   SpO2 97%   Visual Acuity Right Eye Distance:   Left Eye Distance:   Bilateral Distance:  Right Eye Near:   Left Eye Near:    Bilateral Near:     Physical Exam Vitals and nursing note reviewed.  Constitutional:      General: She is not in acute distress.    Appearance: She is well-developed. She is not ill-appearing.  HENT:     Head: Normocephalic and atraumatic.     Right Ear: Tympanic membrane normal.     Left Ear: Tympanic membrane normal.     Nose: Nose normal.     Mouth/Throat:     Mouth: Mucous membranes are moist.     Pharynx: Posterior oropharyngeal erythema present. No oropharyngeal exudate.  Eyes:     Conjunctiva/sclera: Conjunctivae normal.  Cardiovascular:     Rate and Rhythm: Normal rate and regular rhythm.     Heart sounds: No murmur heard.   Pulmonary:     Effort: Pulmonary effort is normal. No respiratory distress.     Breath sounds: Normal breath sounds. No wheezing or rhonchi.  Abdominal:     Palpations: Abdomen is soft.     Tenderness: There is no abdominal tenderness. There is no guarding or rebound.  Musculoskeletal:     Cervical back: Neck supple.  Skin:    General: Skin is warm and dry.     Findings: No  rash.  Neurological:     General: No focal deficit present.     Mental Status: She is alert and oriented to person, place, and time.     Gait: Gait normal.  Psychiatric:        Mood and Affect: Mood normal.        Behavior: Behavior normal.      UC Treatments / Results  Labs (all labs ordered are listed, but only abnormal results are displayed) Labs Reviewed  NOVEL CORONAVIRUS, NAA  CULTURE, GROUP A STREP [facility])  POCT RAPID STREP A (OFFICE)    EKG   Radiology No results found.  Procedures Procedures (including critical care time)  Medications Ordered in UC Medications - No data to display  Initial Impression / Assessment and Plan / UC Course  I have reviewed the triage vital signs and the nursing notes.  Pertinent labs & imaging results that were available during my care of the patient were reviewed by me and considered in my medical decision making (see chart for details).   Sore throat.  Rapid strep negative; throat culture pending.  COVID test performed here.  Instructed patient to self quarantine until the test result is back.  Discussed with patient that she can take Tylenol as needed for fever or discomfort.  Instructed patient to go to the emergency department if she develops high fever, shortness of breath, severe diarrhea, or other concerning symptoms.  Patient agrees with plan of care.    Final Clinical Impressions(s) / UC Diagnoses   Final diagnoses:  Sore throat     Discharge Instructions     Your rapid strep test is negative.  A throat culture is pending; we will call you if it is positive requiring treatment.    Your COVID test is pending.  You should self quarantine until the test result is back.    Take Tylenol as needed for fever or discomfort.  Rest and keep yourself hydrated.    Go to the emergency department if you develop acute worsening symptoms.        ED Prescriptions    None     PDMP not reviewed this encounter.   Arlana Pouch,  Fredrich Romans, NP 08/07/20 [phone]

## 2020-08-07 NOTE — Discharge Instructions (Signed)
Your rapid strep test is negative.  A throat culture is pending; we will call you if it is positive requiring treatment.    Your COVID test is pending.  You should self quarantine until the test result is back.    Take Tylenol as needed for fever or discomfort.  Rest and keep yourself hydrated.    Go to the emergency department if you develop acute worsening symptoms.     

## 2020-08-07 NOTE — ED Triage Notes (Signed)
Patient reports fever Saturday evening as well as continued sore throat since Saturday. Throat hurts worse when swallowing

## 2020-08-08 LAB — SARS-COV-2, NAA 2 DAY TAT

## 2020-08-08 LAB — NOVEL CORONAVIRUS, NAA: SARS-CoV-2, NAA: NOT DETECTED

## 2020-08-10 LAB — CULTURE, GROUP A STREP (THRC)

## 2021-02-08 ENCOUNTER — Encounter: Payer: Self-pay | Admitting: Emergency Medicine

## 2021-02-08 ENCOUNTER — Other Ambulatory Visit: Payer: Self-pay

## 2021-02-08 ENCOUNTER — Ambulatory Visit
Admission: EM | Admit: 2021-02-08 | Discharge: 2021-02-08 | Disposition: A | Discharge status: Home / Self Care | Payer: Self-pay | Attending: Emergency Medicine | Admitting: Emergency Medicine

## 2021-02-08 DIAGNOSIS — B349 Viral infection, unspecified: Secondary | ICD-10-CM

## 2021-02-08 MED ORDER — FLUTICASONE PROPIONATE 50 MCG/ACT NA SUSP
2.0000 | Freq: Every day | NASAL | 2 refills | Status: DC
Start: 2021-02-08 — End: 2021-09-11

## 2021-02-08 MED ORDER — CETIRIZINE HCL 10 MG PO TABS
10.0000 mg | ORAL_TABLET | Freq: Every day | ORAL | 0 refills | Status: DC
Start: 2021-02-08 — End: 2021-09-11

## 2021-02-08 NOTE — Discharge Instructions (Addendum)
This is most likely a viral illness.  Continue taking Dayquil/Nyquil and ibuprofen as needed for symptom management.   Drink lots of fluids.  Take Zyrtec 1 pill a day and use Flonase, 2 sprays per nostril a day.   Follow up if symptoms worsen or do not improve in the day few days.

## 2021-02-08 NOTE — ED Triage Notes (Addendum)
Pt presents with nasal congestion and cough x 2 weeks. Denies fever. Took home Covid test this week (Monday) and was negative.

## 2021-02-08 NOTE — ED Notes (Signed)
Patient declined offer for Covid testing.

## 2021-02-08 NOTE — ED Provider Notes (Signed)
Angela Shelton    CSN: [id number] Arrival date & time: 02/08/21  4166      History   Chief Complaint Chief Complaint  Patient presents with  . Nasal Congestion    HPI Angela Shelton is a 30 y.o. female.   Patient is a 30 year old female here with complaints of nasal congestion and cough for the past 2 weeks.  At home COVID test on Monday was negative.  Patient reports having increased congestion in the morning and coughing up mucus.  Taking Nyquil to help with sleep, Dayquil and Mucinex, which are helping with symptoms. Denies any sore throat, ear pain, or fevers.   The history is provided by the patient.    History reviewed. No pertinent past medical history.  There are no problems to display for this patient.   History reviewed. No pertinent surgical history.  OB History    Gravida  1   Para  1   Term  1   Preterm      AB      Living  0     SAB      IAB      Ectopic      Multiple  0   Live Births               Home Medications    Prior to Admission medications   Medication Sig Start Date End Date Taking? Authorizing Provider  cetirizine (ZYRTEC ALLERGY) 10 MG tablet Take 1 tablet (10 mg total) by mouth daily. 02/08/21  Yes Ivette Loyal, NP  etonogestrel-ethinyl estradiol (NUVARING) 0.12-0.015 MG/24HR vaginal ring Place vaginally. 10/29/19 02/08/21 Yes [provider]  fluticasone (FLONASE) 50 MCG/ACT nasal spray Place 2 sprays into both nostrils daily. 02/08/21  Yes Ivette Loyal, NP  medroxyPROGESTERone (PROVERA) 10 MG tablet Take 1 tablet (10 mg total) by mouth daily for 12 days. 05/02/20 05/14/20  Linzie Collin, MD    Family History Family History  Problem Relation Age of Onset  . Diabetes Sister     Social History Social History   Tobacco Use  . Smoking status: Never Smoker  . Smokeless tobacco: Never Used  Substance Use Topics  . Alcohol use: No  . Drug use: No     Allergies   Penicillins   Review of  Systems Review of Systems  Constitutional: Negative for fever.  HENT: Positive for congestion, postnasal drip and sinus pressure. Negative for ear discharge, ear pain, sinus pain and sore throat.   Respiratory: Positive for cough. Negative for shortness of breath.      Physical Exam Triage Vital Signs ED Triage Vitals  Enc Vitals Group     BP 02/08/21 0936 133/88     Pulse Rate 02/08/21 0936 (!) 109     Resp 02/08/21 0936 18     Temp 02/08/21 0936 98.8 F (37.1 C)     Temp Source 02/08/21 0936 Oral     SpO2 02/08/21 0936 97 %     Weight --      Height --      Head Circumference --      Peak Flow --      Pain Score 02/08/21 0939 0     Pain Loc --      Pain Edu? --      Excl. in GC? --    No data found.  Updated Vital Signs BP 133/88 (BP Location: Left Arm)   Pulse (!) 109  Temp 98.8 F (37.1 C) (Oral)   Resp 18   LMP 01/18/2021 (Approximate)   SpO2 97%   Visual Acuity Right Eye Distance:   Left Eye Distance:   Bilateral Distance:    Right Eye Near:   Left Eye Near:    Bilateral Near:     Physical Exam Vitals and nursing note reviewed.  Constitutional:      General: She is not in acute distress.    Appearance: She is not ill-appearing or diaphoretic.  HENT:     Head: Normocephalic and atraumatic.     Right Ear: Tympanic membrane normal.     Left Ear: Tympanic membrane normal.     Nose: Congestion present.     Mouth/Throat:     Mouth: Mucous membranes are moist.     Pharynx: Oropharynx is clear. Posterior oropharyngeal erythema present. No oropharyngeal exudate.     Tonsils: No tonsillar exudate or tonsillar abscesses. 0 on the right. 0 on the left.  Eyes:     Conjunctiva/sclera: Conjunctivae normal.  Cardiovascular:     Rate and Rhythm: Regular rhythm.     Pulses: Normal pulses.     Heart sounds: Normal heart sounds.  Pulmonary:     Effort: Pulmonary effort is normal.     Breath sounds: Normal breath sounds.  Musculoskeletal:        General:  Normal range of motion.  Skin:    General: Skin is warm and dry.  Neurological:     Mental Status: She is alert.  Psychiatric:        Mood and Affect: Mood normal.      UC Treatments / Results  Labs (all labs ordered are listed, but only abnormal results are displayed) Labs Reviewed - No data to display  EKG   Radiology No results found.  Procedures Procedures (including critical care time)  Medications Ordered in UC Medications - No data to display  Initial Impression / Assessment and Plan / UC Course  I have reviewed the triage vital signs and the nursing notes.  Pertinent labs & imaging results that were available during my care of the patient were reviewed by me and considered in my medical decision making (see chart for details).     Viral Illness.  At home COVID test was negative and patient did not want a COVID test at this time.  Assessment was negative for red flags.  Taking OTC Dayquil/Nyquil, Mucinex, and ibuprofen for symptom relief.  Prescriptions for Flonase and zyrtec given.  Encouraged fluid intake   Final Clinical Impressions(s) / UC Diagnoses   Final diagnoses:  Viral illness     Discharge Instructions     This is most likely a viral illness.  Continue taking Dayquil/Nyquil and ibuprofen as needed for symptom management.   Drink lots of fluids.  Take Zyrtec 1 pill a day and use Flonase, 2 sprays per nostril a day.   Follow up if symptoms worsen or do not improve in the day few days.     ED Prescriptions    Medication Sig Dispense Auth. Provider   cetirizine (ZYRTEC ALLERGY) 10 MG tablet Take 1 tablet (10 mg total) by mouth daily. 15 tablet Ivette Loyal, NP   fluticasone (FLONASE) 50 MCG/ACT nasal spray Place 2 sprays into both nostrils daily. 16 g Ivette Loyal, NP     PDMP not reviewed this encounter.   Ivette Loyal, NP 02/08/21 1012

## 2021-04-17 ENCOUNTER — Encounter: Payer: Self-pay | Admitting: Obstetrics and Gynecology

## 2021-04-30 ENCOUNTER — Encounter: Payer: Self-pay | Admitting: Obstetrics and Gynecology

## 2021-05-23 ENCOUNTER — Encounter: Payer: Self-pay | Admitting: Obstetrics and Gynecology

## 2021-05-24 ENCOUNTER — Encounter: Payer: Self-pay | Admitting: Obstetrics and Gynecology

## 2021-08-28 ENCOUNTER — Telehealth: Payer: Self-pay | Admitting: Obstetrics and Gynecology

## 2021-08-28 NOTE — Telephone Encounter (Signed)
Lucee called in and states she is pregnant and has an appointment on the 20th for a pregnancy confirmation with Dr. Logan Bores.  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  Patient wants to know if something can be called in for her nausea.  Patient uses [facility] on [address]

## 2021-08-29 MED ORDER — DOXYLAMINE-PYRIDOXINE 10-10 MG PO TBEC
DELAYED_RELEASE_TABLET | ORAL | 1 refills | Status: DC
Start: 2021-08-29 — End: 2021-09-11

## 2021-09-05 ENCOUNTER — Telehealth: Payer: Self-pay | Admitting: Obstetrics and Gynecology

## 2021-09-05 NOTE — Telephone Encounter (Signed)
Pt called stating that she can not get her nausea under control- her apt id [id number], there are not any sooner appointments. Pt states tat the medication rx is not helping as it did not help with her first pregnancy, but in previous pregnancy zofran did help; pt requesting zofan to help control nausea. Please Advise.

## 2021-09-11 ENCOUNTER — Encounter: Payer: Self-pay | Admitting: Obstetrics and Gynecology

## 2021-09-11 ENCOUNTER — Ambulatory Visit (INDEPENDENT_AMBULATORY_CARE_PROVIDER_SITE_OTHER): Payer: Self-pay | Admitting: Obstetrics and Gynecology

## 2021-09-11 ENCOUNTER — Other Ambulatory Visit: Payer: Self-pay

## 2021-09-11 VITALS — BP 139/85 | HR 83 | Resp 16 | Ht 65.0 in | Wt 216.0 lb

## 2021-09-11 DIAGNOSIS — Z3200 Encounter for pregnancy test, result unknown: Secondary | ICD-10-CM

## 2021-09-11 DIAGNOSIS — O219 Vomiting of pregnancy, unspecified: Secondary | ICD-10-CM

## 2021-09-11 LAB — POCT URINE PREGNANCY: Preg Test, Ur: POSITIVE — AB

## 2021-09-11 MED ORDER — ONDANSETRON HCL 4 MG PO TABS: 4.0000 mg | ORAL_TABLET | Freq: Three times a day (TID) | ORAL | 1 refills | Status: DC | PRN

## 2021-09-11 NOTE — Progress Notes (Signed)
HPI:      [name] is a 30 y.o. G2P1000 who LMP was Patient's last menstrual period was 07/19/2021 (approximate).  Subjective:   She presents today for pregnancy confirmation.  She stopped her birth control and was able to become pregnant within 3 months.  She is currently taking prenatal vitamins but she is having trouble keeping them down because of the taste of the Gummies.  She will consider taking a pill instead and see if that works better. She has experienced nausea and vomiting during this pregnancy and often vomits between 2 and 4 times daily.  She states that she had nausea vomiting throughout her entire pregnancy last pregnancy.  She says the only medication that work was Zofran.  She has tried Diclegis in this pregnancy and has not helped. Last menstrual period is unknown.    Hx: The following portions of the patient's history were reviewed and updated as appropriate:             She  has no past medical history on file. She does not have any pertinent problems on file. She  has no past surgical history on file. Her family history includes Diabetes in her sister. She  reports that she has never smoked. She has never used smokeless tobacco. She reports that she does not drink alcohol and does not use drugs. She has a current medication list which includes the following prescription(s): ondansetron. She is allergic to penicillins.       Review of Systems:  Review of Systems  Constitutional: Denied constitutional symptoms, night sweats, recent illness, fatigue, fever, insomnia and weight loss.  Eyes: Denied eye symptoms, eye pain, photophobia, vision change and visual disturbance.  Ears/Nose/Throat/Neck: Denied ear, nose, throat or neck symptoms, hearing loss, nasal discharge, sinus congestion and sore throat.  Cardiovascular: Denied cardiovascular symptoms, arrhythmia, chest pain/pressure, edema, exercise intolerance, orthopnea and palpitations.  Respiratory: Denied  pulmonary symptoms, asthma, pleuritic pain, productive sputum, cough, dyspnea and wheezing.  Gastrointestinal: Denied, gastro-esophageal reflux, melena, nausea and vomiting.  Genitourinary: Denied genitourinary symptoms including symptomatic vaginal discharge, pelvic relaxation issues, and urinary complaints.  Musculoskeletal: Denied musculoskeletal symptoms, stiffness, swelling, muscle weakness and myalgia.  Dermatologic: Denied dermatology symptoms, rash and scar.  Neurologic: Denied neurology symptoms, dizziness, headache, neck pain and syncope.  Psychiatric: Denied psychiatric symptoms, anxiety and depression.  Endocrine: Denied endocrine symptoms including hot flashes and night sweats.   Meds:   No current outpatient medications on file prior to visit.   No current facility-administered medications on file prior to visit.      Objective:     Vitals:   09/11/21 1531  BP: 139/85  Pulse: 83  Resp: 16   Filed Weights   09/11/21 1531  Weight: 216 lb (98 kg)                        Assessment:    G2P1000 There are no problems to display for this patient.    1. Possible pregnancy, not yet confirmed   2. Possible pregnancy, not confirmed   3. Nausea and vomiting of pregnancy, antepartum        Plan:           Prenatal Plan 1.  The patient was given prenatal literature. 2.  She was continued on prenatal vitamins. 3.  A prenatal lab panel to be drawn at nurse visit. 4.  An ultrasound was ordered to better determine an EDC. 5.  A nurse visit was scheduled. 6.  Genetic testing and testing for other inheritable conditions discussed in detail. She will decide in the future whether to have these labs performed. 7.  A general overview of pregnancy testing, visit schedule, ultrasound schedule, and prenatal care was discussed. 8.  COVID and its risks associated with pregnancy, prevention by limiting exposure and use of masks, as well as the risks and benefits of  vaccination during pregnancy were discussed in detail.  [facility] policy regarding office and hospital visitation and testing was explained. 9.  Benefits of breast-feeding discussed in detail including both maternal and infant benefits. Ready Set Baby website discussed. 10.  Zofran prescribed for persistent vomiting.  Orders Orders Placed This Encounter  Procedures   US OB Comp Less 14 Wks   POCT urine pregnancy     Meds ordered this encounter  Medications   ondansetron (ZOFRAN) 4 MG tablet    Sig: Take 1 tablet (4 mg total) by mouth every 8 (eight) hours as needed for nausea or vomiting.    Dispense:  30 tablet    Refill:  1      F/U  Return in about 5 weeks (around 10/16/2021). I spent 26 minutes involved in the care of this patient preparing to see the patient by obtaining and reviewing her medical history (including labs, imaging tests and prior procedures), documenting clinical information in the electronic health record (EHR), counseling and coordinating care plans, writing and sending prescriptions, ordering tests or procedures and in direct communicating with the patient and medical staff discussing pertinent items from her history and physical exam.  Elonda Husky, M.D. 09/11/2021 4:07 PM

## 2021-09-17 ENCOUNTER — Other Ambulatory Visit: Payer: Self-pay

## 2021-09-19 ENCOUNTER — Other Ambulatory Visit: Payer: Self-pay

## 2021-09-19 ENCOUNTER — Ambulatory Visit (INDEPENDENT_AMBULATORY_CARE_PROVIDER_SITE_OTHER): Payer: Self-pay

## 2021-09-19 DIAGNOSIS — Z3200 Encounter for pregnancy test, result unknown: Secondary | ICD-10-CM

## 2021-09-22 ENCOUNTER — Emergency Department
Admission: EM | Admit: 2021-09-22 | Discharge: 2021-09-22 | Disposition: A | Discharge status: Home / Self Care | Payer: Self-pay | Attending: Emergency Medicine | Admitting: Emergency Medicine

## 2021-09-22 ENCOUNTER — Other Ambulatory Visit: Payer: Self-pay

## 2021-09-22 DIAGNOSIS — Z3A10 10 weeks gestation of pregnancy: Secondary | ICD-10-CM | POA: Insufficient documentation

## 2021-09-22 DIAGNOSIS — O210 Mild hyperemesis gravidarum: Secondary | ICD-10-CM | POA: Insufficient documentation

## 2021-09-22 LAB — URINALYSIS, COMPLETE (UACMP) WITH MICROSCOPIC
Bilirubin Urine: NEGATIVE
Glucose, UA: NEGATIVE mg/dL
Hgb urine dipstick: NEGATIVE
Ketones, ur: 5 mg/dL — AB
Nitrite: NEGATIVE
Protein, ur: NEGATIVE mg/dL
Specific Gravity, Urine: 1.012 (ref 1.005–1.030)
pH: 6 (ref 5.0–8.0)

## 2021-09-22 MED ORDER — SODIUM CHLORIDE 0.9 % IV BOLUS
1000.0000 mL | Freq: Once | INTRAVENOUS | Status: AC
  Administered 2021-09-22: 1000 mL via INTRAVENOUS

## 2021-09-22 MED ORDER — ONDANSETRON HCL 4 MG/2ML IJ SOLN
4.0000 mg | Freq: Once | INTRAMUSCULAR | Status: AC
  Administered 2021-09-22: 4 mg via INTRAVENOUS
  Filled 2021-09-22: qty 2

## 2021-09-22 NOTE — ED Provider Notes (Signed)
[facility] Emergency Department Provider Note  ____________________________________________   Event Date/Time   First MD Initiated Contact with Patient 09/22/21 1038     (approximate)  I have reviewed the triage vital signs and the nursing notes.   HISTORY  Chief Complaint Emesis During Pregnancy    HPI Angela Shelton is a 30 y.o. female Pt presents to emergency department with complaints of emesis during pregnancy.  Patient is [redacted] weeks pregnant with second child.  Patient reports morning sickness for the past 4 weeks.  OB ([facility]) has given Diclegis and Zofran with no relief.  Patient has vomited 4 times since 8 PM last night.  She reports that she is unable to keep any fluids down.  Patient reports she has 0 out of 10 pain patient contacted OB office and they advised her to report to the emergency room.     History reviewed. No pertinent past medical history.  There are no problems to display for this patient.   History reviewed. No pertinent surgical history.  Prior to Admission medications   Medication Sig Start Date End Date Taking? Authorizing Provider  ondansetron (ZOFRAN) 4 MG tablet Take 1 tablet (4 mg total) by mouth every 8 (eight) hours as needed for nausea or vomiting. 09/11/21   Linzie Collin, MD    Allergies Penicillins  Family History  Problem Relation Age of Onset   Diabetes Sister     Social History Social History   Tobacco Use   Smoking status: Never   Smokeless tobacco: Never  Substance Use Topics   Alcohol use: No   Drug use: No    Review of Systems  Constitutional: No fever/chills Eyes: No visual changes. ENT: No sore throat.  Denies nasal congestion or runny nose Cardiovascular: Denies chest pain. Respiratory: Denies shortness of breath.  Denies cough Gastrointestinal: No abdominal pain.   No diarrhea.  No constipation.  Patient endorses nausea and vomiting Genitourinary: Negative for  dysuria. Musculoskeletal: Negative for back pain. Skin: Negative for rash. Neurological: Negative for headaches, focal weakness or numbness. Allergic/Immunilogical: Patient has allergy to penicillin  ____________________________________________   PHYSICAL EXAM:  VITAL SIGNS: ED Triage Vitals  Enc Vitals Group     BP 09/22/21 0906 (!) 143/82     Pulse Rate 09/22/21 0906 88     Resp 09/22/21 0906 20     Temp 09/22/21 0906 98.5 F (36.9 C)     Temp Source 09/22/21 0906 Oral     SpO2 09/22/21 0906 99 %     Weight 09/22/21 0906 206 lb (93.4 kg)     Height 09/22/21 0906 5\' 4"  (1.626 m)     Head Circumference --      Peak Flow --      Pain Score 09/22/21 0911 1     Pain Loc --      Pain Edu? --      Excl. in GC? --     Constitutional: Alert and oriented. Well appearing and in no acute distress. Eyes: Conjunctivae are normal. PERRL. EOMI. Head: Atraumatic. Nose: No congestion/rhinnorhea. Mouth/Throat: Mucous membranes are moist.  Oropharynx non-erythematous. Cardiovascular: Normal rate, regular rhythm. Grossly normal heart sounds.  Good peripheral circulation.  Mildly elevated blood pressure. Respiratory: Normal respiratory effort.  No retractions. Lungs CTAB. Gastrointestinal: Soft and nontender. No distention. No abdominal bruits. No CVA tenderness. Genitourinary: Deferred Musculoskeletal: No lower extremity tenderness nor edema.  No joint effusions. Neurologic:  Normal speech and language. No gross  focal neurologic deficits are appreciated. No gait instability. Skin:  Skin is warm, dry and intact. No rash noted. Psychiatric: Mood and affect are normal. Speech and behavior are normal.  ____________________________________________   LABS (all labs ordered are listed, but only abnormal results are displayed)  Labs Reviewed  URINALYSIS, COMPLETE (UACMP) WITH MICROSCOPIC - Abnormal; Notable for the following components:      Result Value   Color, Urine YELLOW (*)     APPearance CLOUDY (*)    Ketones, ur 5 (*)    Leukocytes,Ua SMALL (*)    All other components within normal limits   ____________________________________________  EKG   ____________________________________________  RADIOLOGY I, Herschell Dimes, personally viewed and evaluated these images (plain radiographs) as part of my medical decision making, as well as reviewing the written report by the radiologist.  ED MD interpretation:   Official radiology report(s): No results found.  ____________________________________________   PROCEDURES  Procedure(s) performed (including Critical Care):  Procedures  NS 1000 ml bolus  ____________________________________________   INITIAL IMPRESSION / ASSESSMENT AND PLAN / ED COURSE  As part of my medical decision making, I reviewed the following data within the electronic MEDICAL RECORD NUMBER         Patient presents with morning sickness and emesis x4 weeks that has worsened over the last 24 hours.  Patient reports that she had vomited 4 times since 8 PM last night.  Patient exam reveals moist mucous membranes and no signs of dehydration.  Patient is in no acute distress. Patient had IV started and was given 1000 mL bolus of normal saline.  Patient was given Zofran which she expresses has resolved her nausea at this time.  UA was obtained and reviewed.  UA was positive for ketones and small amount of leukocytes. Patient was p.o. challenged and was able to hold fluids down without difficulty.  We will plan to discharge home and follow-up with [facility] if nausea and vomiting continue.      ____________________________________________   FINAL CLINICAL IMPRESSION(S) / ED DIAGNOSES  Final diagnoses:  Hyperemesis gravidarum     ED Discharge Orders     None        Note:  This document was prepared using Dragon voice recognition software and may include unintentional dictation errors.   Herschell Dimes, NP 09/22/21  1211    Georga Hacking, MD 09/22/21 [id number]

## 2021-09-22 NOTE — ED Notes (Signed)
RN to bedside. Pt in no acute distress. [redacted] weeks pregnant. Pt has vomited multiple times. She called her OBGYN and they told her to come here. She is taking Zofran currently. She started Zofran about 2 weeks ago and doesn't feel better.

## 2021-09-22 NOTE — ED Notes (Signed)
Pt reports feeling much better, tolerating po fluids without n/v

## 2021-09-22 NOTE — ED Triage Notes (Signed)
Pt comes pov with emesis in pregnancy. Pt is on diclegis. [facility] is closed today. LMP July 28.

## 2021-09-22 NOTE — Discharge Instructions (Addendum)
Please read and follow discharge instructions.  Continue taking previous medications as ordered by [facility].  Follow-up with [facility] if symptoms persist

## 2021-09-24 ENCOUNTER — Telehealth: Payer: Self-pay | Admitting: Obstetrics and Gynecology

## 2021-09-24 NOTE — Telephone Encounter (Signed)
Patient wen to ER on Saturday for sever vomiting(x6 times), they asked her to follow up with office. Pt states that hospital had mentioned trying a different medication than Zofran to control nausea. Pt is asking if she needs to be seen or if she can try a different medication, confirmed her pharmacy as [facility] on [address]. Please Advise.

## 2021-09-26 ENCOUNTER — Telehealth: Payer: Self-pay | Admitting: Obstetrics and Gynecology

## 2021-09-26 ENCOUNTER — Other Ambulatory Visit: Payer: Self-pay | Admitting: Obstetrics and Gynecology

## 2021-09-26 MED ORDER — METOCLOPRAMIDE HCL 5 MG PO TABS
5.0000 mg | ORAL_TABLET | Freq: Three times a day (TID) | ORAL | 0 refills | Status: DC
Start: 2021-09-26 — End: 2021-09-26

## 2021-09-26 MED ORDER — ONDANSETRON 4 MG PO TBDP
4.0000 mg | ORAL_TABLET | Freq: Three times a day (TID) | ORAL | 0 refills | Status: DC | PRN
Start: 2021-09-26 — End: 2021-10-01

## 2021-09-26 NOTE — Telephone Encounter (Signed)
Patient called following up - states she is miserable.

## 2021-09-27 ENCOUNTER — Other Ambulatory Visit: Payer: Self-pay

## 2021-09-27 DIAGNOSIS — O219 Vomiting of pregnancy, unspecified: Secondary | ICD-10-CM

## 2021-09-27 MED ORDER — ONDANSETRON 4 MG PO TBDP: 4.0000 mg | ORAL_TABLET | Freq: Three times a day (TID) | ORAL | 0 refills | Status: DC | PRN

## 2021-10-01 ENCOUNTER — Other Ambulatory Visit: Payer: Self-pay

## 2021-10-01 ENCOUNTER — Ambulatory Visit (INDEPENDENT_AMBULATORY_CARE_PROVIDER_SITE_OTHER): Payer: Self-pay | Admitting: Obstetrics and Gynecology

## 2021-10-01 VITALS — BP 134/85 | HR 84 | Wt 209.6 lb

## 2021-10-01 DIAGNOSIS — Z3A10 10 weeks gestation of pregnancy: Secondary | ICD-10-CM

## 2021-10-01 DIAGNOSIS — O99210 Obesity complicating pregnancy, unspecified trimester: Secondary | ICD-10-CM

## 2021-10-01 DIAGNOSIS — O219 Vomiting of pregnancy, unspecified: Secondary | ICD-10-CM

## 2021-10-01 DIAGNOSIS — Z113 Encounter for screening for infections with a predominantly sexual mode of transmission: Secondary | ICD-10-CM

## 2021-10-01 DIAGNOSIS — Z3401 Encounter for supervision of normal first pregnancy, first trimester: Secondary | ICD-10-CM

## 2021-10-01 MED ORDER — METOCLOPRAMIDE HCL 5 MG PO TABS: 5.0000 mg | ORAL_TABLET | Freq: Four times a day (QID) | ORAL | 0 refills | Status: DC | PRN

## 2021-10-01 NOTE — Progress Notes (Deleted)
Lynann L Baldassari presents for NOB nurse interview visit. Pregnancy confirmation done 09/11/2021.  G2P1001. Pregnancy education material explained and given. No cats in the home. NOB labs ordered. TSH/HbgA1c due to Increased BMI. Body mass index is 35.98 kg/m.  HIV labs and Drug screen were explained optional and she did not decline. Drug screen ordered. PNV encouraged, she has been taking PNV and has an adequate supply right now. No rx or samples needed at this time. Genetic screening options discussed. Genetic testing: Ordered.  Pt may discuss with provider.  Financial policy reviewed,she will be self pay until insurance is fixed.   FMLA form reviewed and signed . Pt. to follow up with provider in 2 weeks for NOB physical. She continues to have nausea and vomiting with the Zofran. Advised by Dr. Valentino Saxon to add Reglan.  All questions answered.

## 2021-10-01 NOTE — Patient Instructions (Incomplete)
First Trimester of Pregnancy The first trimester of pregnancy starts on the first day of your last menstrual period until the end of week 12. This is also called months 1 through 3 of pregnancy. Body changes during your first trimester Your body goes through many changes during pregnancy. The changes usually return to normal after your baby is born. Physical changes You may gain or lose weight. Your breasts may grow larger and hurt. The area around your nipples may get darker. Dark spots or blotches may develop on your face. You may have changes in your hair. Health changes You may feel like you might vomit (nauseous), and you may vomit. You may have heartburn. You may have headaches. You may have trouble pooping (constipation). Your gums may bleed. Other changes You may get tired easily. You may pee (urinate) more often. Your menstrual periods will stop. You may not feel hungry. You may want to eat certain kinds of food. You may have changes in your emotions from day to day. You may have more dreams. Follow these instructions at home: Medicines Take over-the-counter and prescription medicines only as told by your doctor. Some medicines are not safe during pregnancy. Take a prenatal vitamin that contains at least 600 micrograms (mcg) of folic acid. Eating and drinking Eat healthy meals that include: Fresh fruits and vegetables. Whole grains. Good sources of protein, such as meat, eggs, or tofu. Low-fat dairy products. Avoid raw meat and unpasteurized juice, milk, and cheese. If you feel like you may vomit, or you vomit: Eat 4 or 5 small meals a day instead of 3 large meals. Try eating a few soda crackers. Drink liquids between meals instead of during meals. You may need to take these actions to prevent or treat trouble pooping: Drink enough fluids to keep your pee (urine) pale yellow. Eat foods that are high in fiber. These include beans, whole grains, and fresh fruits and  vegetables. Limit foods that are high in fat and sugar. These include fried or sweet foods. Activity Exercise only as told by your doctor. Most people can do their usual exercise routine during pregnancy. Stop exercising if you have cramps or pain in your lower belly (abdomen) or low back. Do not exercise if it is too hot or too humid, or if you are in a place of great height (high altitude). Avoid heavy lifting. If you choose to, you may have sex unless your doctor tells you not to. Relieving pain and discomfort Wear a good support bra if your breasts are sore. Rest with your legs raised (elevated) if you have leg cramps or low back pain. If you have bulging veins (varicose veins) in your legs: Wear support hose as told by your doctor. Raise your feet for 15 minutes, 3-4 times a day. Limit salt in your food. Safety Wear your seat belt at all times when you are in a car. Talk with your doctor if someone is hurting you or yelling at you. Talk with your doctor if you are feeling sad or have thoughts of hurting yourself. Lifestyle Do not use hot tubs, steam rooms, or saunas. Do not douche. Do not use tampons or scented sanitary pads. Do not use herbal medicines, illegal drugs, or medicines that are not approved by your doctor. Do not drink alcohol. Do not smoke or use any products that contain nicotine or tobacco. If you need help quitting, ask your doctor. Avoid cat litter boxes and soil that is used by cats. These carry germs  that can cause harm to the baby and can cause a loss of your baby by miscarriage or stillbirth. General instructions Keep all follow-up visits. This is important. Ask for help if you need counseling or if you need help with nutrition. Your doctor can give you advice or tell you where to go for help. Visit your dentist. At home, brush your teeth with a soft toothbrush. Floss gently. Write down your questions. Take them to your prenatal visits. Where to find more  information American Pregnancy Association: americanpregnancy.org [employer]: www.acog.org Office on Women's Health: KeywordPortfolios.com.br Contact a doctor if: You are dizzy. You have a fever. You have mild cramps or pressure in your lower belly. You have a nagging pain in your belly area. You continue to feel like you may vomit, you vomit, or you have watery poop (diarrhea) for 24 hours or longer. You have a bad-smelling fluid coming from your vagina. You have pain when you pee. You are exposed to a disease that spreads from person to person, such as chickenpox, measles, Zika virus, HIV, or hepatitis. Get help right away if: You have spotting or bleeding from your vagina. You have very bad belly cramping or pain. You have shortness of breath or chest pain. You have any kind of injury, such as from a fall or a car crash. You have new or increased pain, swelling, or redness in an arm or leg. Summary The first trimester of pregnancy starts on the first day of your last menstrual period until the end of week 12 (months 1 through 3). Eat 4 or 5 small meals a day instead of 3 large meals. Do not smoke or use any products that contain nicotine or tobacco. If you need help quitting, ask your doctor. Keep all follow-up visits. This information is not intended to replace advice given to you by your health care provider. Make sure you discuss any questions you have with your health care provider. Document Revised: 05/17/2020 Document Reviewed: 03/23/2020 Elsevier Patient Education  [place]. Morning Sickness Morning sickness is when you feel like you may vomit (feel nauseous) during pregnancy. Sometimes, you may vomit. Morning sickness most often happens in the morning, but it can also happen at any time of the day. Some women may have morning sickness that makes them vomit all the time. This is a more serious problem that needs  treatment. What are the causes? The cause of this condition is not known. What increases the risk? You had vomiting or a feeling like you may vomit before your pregnancy. You had morning sickness in another pregnancy. You are pregnant with more than one baby, such as twins. What are the signs or symptoms? Feeling like you may vomit. Vomiting. How is this treated? Treatment is usually not needed for this condition. You may only need to change what you eat. In some cases, your doctor may give you some things to take for your condition. These include: Vitamin B6 supplements. Medicines to treat the feeling that you may vomit. Ginger. Follow these instructions at home: Medicines Take over-the-counter and prescription medicines only as told by your doctor. Do not take any medicines until you talk with your doctor about them first. Take multivitamins before you get pregnant. These can stop or lessen the symptoms of morning sickness. Eating and drinking Eat dry toast or crackers before getting out of bed. Eat 5 or 6 small meals a day. Eat dry and bland foods like rice and  baked potatoes. Do not eat greasy, fatty, or spicy foods. Have someone cook for you if the smell of food causes you to vomit or to feel like you may vomit. If you feel like you may vomit after taking prenatal vitamins, take them at night or with a snack. Eat protein foods when you need a snack. Nuts, yogurt, and cheese are good choices. Drink fluids throughout the day. Try ginger ale made with real ginger, ginger tea made from fresh grated ginger, or ginger candies. General instructions Do not smoke or use any products that contain nicotine or tobacco. If you need help quitting, ask your doctor. Use an air purifier to keep the air in your house free of smells. Get lots of fresh air. Try to avoid smells that make you feel sick. Try wearing an acupressure wristband. This is a wristband that is used to treat seasickness. Try  a treatment called acupuncture. In this treatment, a doctor puts needles into certain areas of your body to make you feel better. Contact a doctor if: You need medicine to feel better. You feel dizzy or light-headed. You are losing weight. Get help right away if: The feeling that you may vomit will not go away, or you cannot stop vomiting. You faint. You have very bad pain in your belly. Summary Morning sickness is when you feel like you may vomit (feel nauseous) during pregnancy. You may feel sick in the morning, but you can feel this way at any time of the day. Making some changes to what you eat may help your symptoms go away. This information is not intended to replace advice given to you by your health care provider. Make sure you discuss any questions you have with your health care provider. Document Revised: 07/24/2020 Document Reviewed: 07/03/2020 Elsevier Patient Education  2022 Elsevier Inc. Commonly Asked Questions During Pregnancy  Cats: A parasite can be excreted in cat feces.  To avoid exposure you need to have another person empty the little box.  If you must empty the litter box you will need to wear gloves.  Wash your hands after handling your cat.  This parasite can also be found in raw or undercooked meat so this should also be avoided.  Colds, Sore Throats, Flu: Please check your medication sheet to see what you can take for symptoms.  If your symptoms are unrelieved by these medications please call the office.  Dental Work: Most any dental work Agricultural consultant recommends is permitted.  X-rays should only be taken during the first trimester if absolutely necessary.  Your abdomen should be shielded with a lead apron during all x-rays.  Please notify your provider prior to receiving any x-rays.  Novocaine is fine; gas is not recommended.  If your dentist requires a note from [place] prior to dental work please call the office and we will provide one for you.  Exercise: Exercise is  an important part of staying healthy during your pregnancy.  You may continue most exercises you were accustomed to prior to pregnancy.  Later in your pregnancy you will most likely notice you have difficulty with activities requiring balance like riding a bicycle.  It is important that you listen to your body and avoid activities that put you at a higher risk of falling.  Adequate rest and staying well hydrated are a must!  If you have questions about the safety of specific activities ask your provider.    Exposure to Children with illness: Try to avoid obvious  exposure; report any symptoms to [place] when noted,  If you have chicken pos, red measles or mumps, you should be immune to these diseases.   Please do not take any vaccines while pregnant unless you have checked with your OB provider.  Fetal Movement: After 28 weeks we recommend you do "kick counts" twice daily.  Lie or sit down in a calm quiet environment and count your baby movements "kicks".  You should feel your baby at least 10 times per hour.  If you have not felt 10 kicks within the first hour get up, walk around and have something sweet to eat or drink then repeat for an additional hour.  If count remains less than 10 per hour notify your provider.  Fumigating: Follow your pest control agent's advice as to how long to stay out of your home.  Ventilate the area well before re-entering.  Hemorrhoids:   Most over-the-counter preparations can be used during pregnancy.  Check your medication to see what is safe to use.  It is important to use a stool softener or fiber in your diet and to drink lots of liquids.  If hemorrhoids seem to be getting worse please call the office.   Hot Tubs:  Hot tubs Jacuzzis and saunas are not recommended while pregnant.  These increase your internal body temperature and should be avoided.  Intercourse:  Sexual intercourse is safe during pregnancy as long as you are comfortable, unless otherwise advised by your  provider.  Spotting may occur after intercourse; report any bright red bleeding that is heavier than spotting.  Labor:  If you know that you are in labor, please go to the hospital.  If you are unsure, please call the office and let us help you decide what to do.  Lifting, straining, etc:  If your job requires heavy lifting or straining please check with your provider for any limitations.  Generally, you should not lift items heavier than that you can lift simply with your hands and arms (no back muscles)  Painting:  Paint fumes do not harm your pregnancy, but may make you ill and should be avoided if possible.  Latex or water based paints have less odor than oils.  Use adequate ventilation while painting.  Permanents & Hair Color:  Chemicals in hair dyes are not recommended as they cause increase hair dryness which can increase hair loss during pregnancy.  " Highlighting" and permanents are allowed.  Dye may be absorbed differently and permanents may not hold as well during pregnancy.  Sunbathing:  Use a sunscreen, as skin burns easily during pregnancy.  Drink plenty of fluids; avoid over heating.  Tanning Beds:  Because their possible side effects are still unknown, tanning beds are not recommended.  Ultrasound Scans:  Routine ultrasounds are performed at approximately 20 weeks.  You will be able to see your baby's general anatomy an if you would like to know the gender this can usually be determined as well.  If it is questionable when you conceived you may also receive an ultrasound early in your pregnancy for dating purposes.  Otherwise ultrasound exams are not routinely performed unless there is a medical necessity.  Although you can request a scan we ask that you pay for it when conducted because insurance does not cover " patient request" scans.  Work: If your pregnancy proceeds without complications you may work until your due date, unless your physician or employer advises  otherwise.  Round Ligament Pain/Pelvic Discomfort:  Hervey Ard,  shooting pains not associated with bleeding are fairly common, usually occurring in the second trimester of pregnancy.  They tend to be worse when standing up or when you remain standing for long periods of time.  These are the result of pressure of certain pelvic ligaments called "round ligaments".  Rest, Tylenol and heat seem to be the most effective relief.  As the womb and fetus grow, they rise out of the pelvis and the discomfort improves.  Please notify the office if your pain seems different than that described.  It may represent a more serious condition.  Linden Dolin Class  June 04, 2017  Wednesday 7:00p - 9:00p  [facility] [place], [place]  July 09, 2017  Wednesday 7:00p - 9:00p [facility] [place], [place]    August 13, 2017   Wednesday 7:00p - 9:000p [facility] [place], [place]  September 10, 2017  Wednesday  7:00p - 9:00p [facility] [place], [place]  October 08, 2017 Wednesday 7:00p - 9:00p [facility] [place], [place]  Interested in a waterbirth?  This informational class will help you discover whether waterbirth is the right fit for you.  Education about waterbirth itself, supplies you would need and how to assemble your support team is what you can expect from this class.  Some obstetrical practices require this class in order to pursue a waterbirth.  (Not all obstetrical practices offer waterbirth check with your healthcare provider)  Register only the expectant mom, but you are encouraged to bring your partner to class!  Fees & Payment No fee  Register Online [email or web address] Search Linden Dolin

## 2021-10-01 NOTE — Telephone Encounter (Signed)
Sent some reglan into her pharmacy today.

## 2021-10-02 LAB — RUBELLA SCREEN: Rubella Antibodies, IGG: 3.23 index (ref >0.99)

## 2021-10-02 LAB — VIRAL HEPATITIS HBV, HCV
HCV Ab: <0.1 s/co ratio (ref 0.0–0.9)
Hep B Core Total Ab: NEGATIVE
Hep B Surface Ab, Qual: NONREACTIVE
Hepatitis B Surface Ag: NEGATIVE

## 2021-10-02 LAB — RPR: RPR Ser Ql: NONREACTIVE

## 2021-10-02 LAB — HIV ANTIBODY (ROUTINE TESTING W REFLEX): HIV Screen 4th Generation wRfx: NONREACTIVE

## 2021-10-02 LAB — HEMOGLOBIN A1C
Est. average glucose Bld gHb Est-mCnc: 100 mg/dL
Hgb A1c MFr Bld: 5.1 % (ref 4.8–5.6)

## 2021-10-02 LAB — ANTIBODY SCREEN: Antibody Screen: NEGATIVE

## 2021-10-02 LAB — URINALYSIS, ROUTINE W REFLEX MICROSCOPIC
Bilirubin, UA: NEGATIVE
Glucose, UA: NEGATIVE
Leukocytes,UA: NEGATIVE
Nitrite, UA: NEGATIVE
Protein,UA: NEGATIVE
RBC, UA: NEGATIVE
Specific Gravity, UA: 1.012 (ref 1.005–1.030)
Urobilinogen, Ur: 0.2 mg/dL (ref 0.2–1.0)
pH, UA: 6 (ref 5.0–7.5)

## 2021-10-02 LAB — PARVOVIRUS B19 ANTIBODY, IGG AND IGM
Parvovirus B19 IgG: 5 index — ABNORMAL HIGH (ref 0.0–0.8)
Parvovirus B19 IgM: 0.5 index (ref 0.0–0.8)

## 2021-10-02 LAB — HCV INTERPRETATION

## 2021-10-02 LAB — TSH: TSH: 0.413 u[IU]/mL — ABNORMAL LOW (ref 0.450–4.500)

## 2021-10-02 LAB — VARICELLA ZOSTER ANTIBODY, IGG: Varicella zoster IgG: 1826 index (ref >165)

## 2021-10-02 LAB — ABO AND RH: Rh Factor: NEGATIVE

## 2021-10-03 LAB — PAIN MGT SCRN (14 DRUGS), UR
Amphetamine Scrn, Ur: NEGATIVE ng/mL
BARBITURATE SCREEN URINE: NEGATIVE ng/mL
BENZODIAZEPINE SCREEN, URINE: NEGATIVE ng/mL
Buprenorphine, Urine: NEGATIVE ng/mL
CANNABINOIDS UR QL SCN: NEGATIVE ng/mL
Cocaine (Metab) Scrn, Ur: NEGATIVE ng/mL
Creatinine(Crt), U: 62.1 mg/dL (ref 20.0–300.0)
Fentanyl, Urine: NEGATIVE pg/mL
Meperidine Screen, Urine: NEGATIVE ng/mL
Methadone Screen, Urine: NEGATIVE ng/mL
OXYCODONE+OXYMORPHONE UR QL SCN: NEGATIVE ng/mL
Opiate Scrn, Ur: NEGATIVE ng/mL
Ph of Urine: 5.3 (ref 4.5–8.9)
Phencyclidine Qn, Ur: NEGATIVE ng/mL
Propoxyphene Scrn, Ur: NEGATIVE ng/mL
Tramadol Screen, Urine: NEGATIVE ng/mL

## 2021-10-03 LAB — NICOTINE SCREEN, URINE: Cotinine Ql Scrn, Ur: NEGATIVE ng/mL

## 2021-10-04 LAB — URINE CULTURE, OB REFLEX

## 2021-10-04 LAB — CULTURE, OB URINE

## 2021-10-05 LAB — GC/CHLAMYDIA PROBE AMP
Chlamydia trachomatis, NAA: NEGATIVE
Neisseria Gonorrhoeae by PCR: NEGATIVE

## 2021-10-18 ENCOUNTER — Other Ambulatory Visit (HOSPITAL_COMMUNITY)
Admission: RE | Admit: 2021-10-18 | Discharge: 2021-10-18 | Disposition: A | Discharge status: Home / Self Care | Payer: Self-pay | Source: Ambulatory Visit | Attending: Obstetrics and Gynecology | Admitting: Obstetrics and Gynecology

## 2021-10-18 ENCOUNTER — Encounter: Payer: Self-pay | Admitting: Obstetrics and Gynecology

## 2021-10-18 ENCOUNTER — Ambulatory Visit (INDEPENDENT_AMBULATORY_CARE_PROVIDER_SITE_OTHER): Payer: Self-pay | Admitting: Obstetrics and Gynecology

## 2021-10-18 ENCOUNTER — Other Ambulatory Visit: Payer: Self-pay

## 2021-10-18 DIAGNOSIS — Z3482 Encounter for supervision of other normal pregnancy, second trimester: Secondary | ICD-10-CM

## 2021-10-18 DIAGNOSIS — Z3A13 13 weeks gestation of pregnancy: Secondary | ICD-10-CM | POA: Insufficient documentation

## 2021-10-18 LAB — POCT URINALYSIS DIPSTICK OB
Bilirubin, UA: NEGATIVE
Blood, UA: NEGATIVE
Glucose, UA: NEGATIVE
Leukocytes, UA: NEGATIVE
Nitrite, UA: NEGATIVE
Spec Grav, UA: >=1.03 — AB (ref 1.010–1.025)
Urobilinogen, UA: 0.2 E.U./dL
pH, UA: 5 (ref 5.0–8.0)

## 2021-10-18 NOTE — Progress Notes (Signed)
NOB: Starting to feel better.  Nausea and vomiting have definitely decreased.  Patient desires genetic testing today.  MaterniT 21 performed.  aFP next visit.  Pap performed today.  Physical examination General NAD, Conversant  HEENT Atraumatic; Op clear with mmm.  Normo-cephalic. Pupils reactive. Anicteric sclerae  Thyroid/Neck Smooth without nodularity or enlargement. Normal ROM.  Neck Supple.  Skin No rashes, lesions or ulceration. Normal palpated skin turgor. No nodularity.  Breasts: No masses or discharge.  Symmetric.  No axillary adenopathy.  Lungs: Clear to auscultation.No rales or wheezes. Normal Respiratory effort, no retractions.  Heart: NSR.  No murmurs or rubs appreciated. No periferal edema  Abdomen: Soft.  Non-tender.  No masses.  No HSM. No hernia  Extremities: Moves all appropriately.  Normal ROM for age. No lymphadenopathy.  Neuro: Oriented to PPT.  Normal mood. Normal affect.     Pelvic:   Vulva: Normal appearance.  No lesions.  Vagina: No lesions or abnormalities noted.  Support: Normal pelvic support.  Urethra No masses tenderness or scarring.  Meatus Normal size without lesions or prolapse.  Cervix: Normal appearance.  No lesions.  Anus: Normal exam.  No lesions.  Perineum: Normal exam.  No lesions.        Bimanual   Adnexae: No masses.  Non-tender to palpation.  Uterus: Enlarged. 157  Non-tender.  Mobile.  AV.  Adnexae: No masses.  Non-tender to palpation.  Cul-de-sac: Negative for abnormality.  Adnexae: No masses.  Non-tender to palpation.         Pelvimetry   Diagonal: Reached.  Spines: Average.  Sacrum: Concave.  Pubic Arch: Normal.

## 2021-10-19 LAB — CBC WITH DIFFERENTIAL/PLATELET
Basophils Absolute: 0.1 10*3/uL (ref 0.0–0.2)
Basos: 0 %
EOS (ABSOLUTE): 0.2 10*3/uL (ref 0.0–0.4)
Eos: 1 %
Hematocrit: 31.7 % — ABNORMAL LOW (ref 34.0–46.6)
Hemoglobin: 11.2 g/dL (ref 11.1–15.9)
Immature Grans (Abs): 0.1 10*3/uL (ref 0.0–0.1)
Immature Granulocytes: 1 %
Lymphocytes Absolute: 2.2 10*3/uL (ref 0.7–3.1)
Lymphs: 16 %
MCH: 31.9 pg (ref 26.6–33.0)
MCHC: 35.3 g/dL (ref 31.5–35.7)
MCV: 90 fL (ref 79–97)
Monocytes Absolute: 0.7 10*3/uL (ref 0.1–0.9)
Monocytes: 5 %
Neutrophils Absolute: 10.8 10*3/uL — ABNORMAL HIGH (ref 1.4–7.0)
Neutrophils: 77 %
Platelets: 296 10*3/uL (ref 150–450)
RBC: 3.51 x10E6/uL — ABNORMAL LOW (ref 3.77–5.28)
RDW: 12.2 % (ref 11.7–15.4)
WBC: 14 10*3/uL — ABNORMAL HIGH (ref 3.4–10.8)

## 2021-10-22 LAB — CYTOLOGY - PAP: Diagnosis: NEGATIVE

## 2021-10-23 LAB — MATERNIT21  PLUS CORE+ESS+SCA, BLOOD

## 2021-10-26 ENCOUNTER — Other Ambulatory Visit: Payer: Self-pay | Admitting: Obstetrics and Gynecology

## 2021-10-26 DIAGNOSIS — O219 Vomiting of pregnancy, unspecified: Secondary | ICD-10-CM

## 2021-11-14 ENCOUNTER — Other Ambulatory Visit: Payer: Self-pay | Admitting: Obstetrics and Gynecology

## 2021-11-14 DIAGNOSIS — O219 Vomiting of pregnancy, unspecified: Secondary | ICD-10-CM

## 2021-11-21 ENCOUNTER — Encounter: Payer: Self-pay | Admitting: Obstetrics and Gynecology

## 2021-11-21 ENCOUNTER — Other Ambulatory Visit: Payer: Self-pay

## 2021-11-21 ENCOUNTER — Ambulatory Visit (INDEPENDENT_AMBULATORY_CARE_PROVIDER_SITE_OTHER): Payer: Self-pay | Admitting: Obstetrics and Gynecology

## 2021-11-21 VITALS — BP 112/84 | HR 98 | Wt 214.2 lb

## 2021-11-21 DIAGNOSIS — Z3A18 18 weeks gestation of pregnancy: Secondary | ICD-10-CM

## 2021-11-21 DIAGNOSIS — O26899 Other specified pregnancy related conditions, unspecified trimester: Secondary | ICD-10-CM

## 2021-11-21 DIAGNOSIS — Z6791 Unspecified blood type, Rh negative: Secondary | ICD-10-CM

## 2021-11-21 DIAGNOSIS — Z3482 Encounter for supervision of other normal pregnancy, second trimester: Secondary | ICD-10-CM

## 2021-11-21 DIAGNOSIS — O219 Vomiting of pregnancy, unspecified: Secondary | ICD-10-CM

## 2021-11-21 NOTE — Progress Notes (Signed)
ROB: She continues to have vomiting 3 x daily. She is taking Zofran and it helps. She stopped taking Reglan because it makes her feel tired.

## 2021-11-21 NOTE — Progress Notes (Signed)
ROB: Nausea and vomiting still present, but less than before (now 2-3 times per week).  Taking Zofran as prescribed. Recommended to also resume Diclegis in addition. Starting to feel some fetal movement occasionally. Normal MaterniT21, declines AFP. Rh neg, will need Rhogam in 3rd trimester. Declines flu vaccine. Has received COVID vaccines. Scheduled for anatomy scan in 2 weeks. RTC in 4 weeks.

## 2021-11-21 NOTE — Patient Instructions (Signed)

## 2021-11-28 ENCOUNTER — Telehealth: Payer: Self-pay | Admitting: Obstetrics and Gynecology

## 2021-11-28 NOTE — Telephone Encounter (Signed)
Called patient to recommend that most pink eye resolves on its own, wash hands and towels and pick up otc medication if needed.

## 2021-11-28 NOTE — Telephone Encounter (Signed)
Pt states that her 28 year old has pink eye- she believes that she has caught pink eye as well, she is currently [redacted] weeks pregnant. Pt does not have a PCP, pt is asking how she goes about getting treatment for this. Please Advise.

## 2021-12-06 ENCOUNTER — Other Ambulatory Visit: Payer: Self-pay

## 2021-12-06 DIAGNOSIS — Z3482 Encounter for supervision of other normal pregnancy, second trimester: Secondary | ICD-10-CM

## 2021-12-06 DIAGNOSIS — Z3689 Encounter for other specified antenatal screening: Secondary | ICD-10-CM

## 2021-12-10 ENCOUNTER — Other Ambulatory Visit: Payer: Self-pay | Admitting: Obstetrics and Gynecology

## 2021-12-10 DIAGNOSIS — O219 Vomiting of pregnancy, unspecified: Secondary | ICD-10-CM

## 2021-12-12 ENCOUNTER — Ambulatory Visit
Admission: RE | Admit: 2021-12-12 | Discharge: 2021-12-12 | Disposition: A | Discharge status: Home / Self Care | Payer: Self-pay | Source: Ambulatory Visit | Attending: Obstetrics and Gynecology | Admitting: Obstetrics and Gynecology

## 2021-12-12 DIAGNOSIS — Z3482 Encounter for supervision of other normal pregnancy, second trimester: Secondary | ICD-10-CM | POA: Insufficient documentation

## 2021-12-12 DIAGNOSIS — Z3689 Encounter for other specified antenatal screening: Secondary | ICD-10-CM | POA: Insufficient documentation

## 2021-12-19 ENCOUNTER — Encounter: Payer: Self-pay | Admitting: Obstetrics and Gynecology

## 2021-12-20 ENCOUNTER — Ambulatory Visit (INDEPENDENT_AMBULATORY_CARE_PROVIDER_SITE_OTHER): Payer: Self-pay | Admitting: Obstetrics and Gynecology

## 2021-12-20 ENCOUNTER — Other Ambulatory Visit: Payer: Self-pay

## 2021-12-20 ENCOUNTER — Encounter: Payer: Self-pay | Admitting: Obstetrics and Gynecology

## 2021-12-20 VITALS — BP 130/65 | HR 86 | Wt 214.9 lb

## 2021-12-20 DIAGNOSIS — Z3482 Encounter for supervision of other normal pregnancy, second trimester: Secondary | ICD-10-CM

## 2021-12-20 DIAGNOSIS — Z3A22 22 weeks gestation of pregnancy: Secondary | ICD-10-CM

## 2021-12-20 LAB — POCT URINALYSIS DIPSTICK OB
Bilirubin, UA: NEGATIVE
Glucose, UA: NEGATIVE
Ketones, UA: NEGATIVE
Leukocytes, UA: NEGATIVE
Nitrite, UA: NEGATIVE
POC,PROTEIN,UA: NEGATIVE
Spec Grav, UA: >=1.03 — AB (ref 1.010–1.025)
Urobilinogen, UA: 0.2 E.U./dL
pH, UA: 6 (ref 5.0–8.0)

## 2021-12-20 NOTE — Progress Notes (Signed)
ROB. Patient complains of heartburn with no relief from TUMS or prilosec. Patient states experiencing some sharp round ligament pain while walking around an amusement park. Patient states no other questions or concerns.

## 2021-12-20 NOTE — Progress Notes (Signed)
ROB: Continues to experience heartburn.  We discussed eating before bed, systematic use of Prilosec, bland foods etc.  Patient feeling daily fetal movement.  1 hour GCT next visit.

## 2021-12-23 NOTE — L&D Delivery Note (Signed)
Delivery Summary for Angela Shelton ? ?Labor Events:   ?Preterm labor: No data found  ?Rupture date: 04/12/2022  ?Rupture time: 6:43 AM  ?Rupture type: Artificial  ?Fluid Color: Clear  ?Induction: No data found  ?Augmentation: No data found  ?Complications: No data found  ?Cervical ripening: No data found No data found  ? No data found  ?   ?Delivery:   ?Episiotomy: No data found  ?Lacerations: No data found  ?Repair suture: No data found  ?Repair # of packets: No data found  ?Blood loss (ml): 500  ? ?Information for the patient's newborn:  Zhara, Gieske [[id number]]  ? ?Delivery ?04/12/2022 1:15 PM by  Vaginal, Spontaneous ?Sex:  female Gestational Age: [redacted]w[redacted]d ?Delivery Clinician:   ?Living?:  ? ?      APGARS  One minute Five minutes Ten minutes  ?Skin color:        ?Heart rate:        ?Grimace:        ?Muscle tone:        ?Breathing:        ?Totals: 5  9     ? ?Presentation/position:      ?Resuscitation:   ?Cord information:    Disposition of cord blood:     Blood gases sent?  ?Complications:   ?Placenta: Delivered:       appearance ?Newborn Measurements: ?Weight: 8 lb 6.8 oz (3820 g)  Height: 20.75"  Head circumference:    Chest circumference:    ?Other providers:    ?Additional  information: ?Forceps:   ?Vacuum:   ?Breech:   ?Observed anomalies   ? ? ? ? ?Delivery Note ?At 1:15 PM a viable and healthy female was delivered via Vaginal, Spontaneous (Presentation: Right Occiput Anterior).  APGAR: 5, 9; weight  3820 grams.   ?Placenta status: Spontaneous, Intact.  Cord: 3 vessels with the following complications: None.  Cord pH: not obtained. Delayed cord clamping observed.  Cord blood collected.  ? ?Anesthesia: Epidural ?Episiotomy: None ?Lacerations: 1st degree;Perineal (hemostatic) ?Suture Repair:  None ?Est. Blood Loss (mL): 500 ? ?Mom to postpartum.  Baby to Couplet care / Skin to Skin. ? ?Hildred Laser, MD ?04/12/2022, 1:57 PM ? ?

## 2021-12-27 ENCOUNTER — Other Ambulatory Visit: Payer: Self-pay | Admitting: Obstetrics and Gynecology

## 2021-12-27 DIAGNOSIS — O219 Vomiting of pregnancy, unspecified: Secondary | ICD-10-CM

## 2022-01-16 ENCOUNTER — Encounter: Payer: Self-pay | Admitting: Obstetrics and Gynecology

## 2022-01-16 ENCOUNTER — Other Ambulatory Visit: Payer: Self-pay

## 2022-01-16 ENCOUNTER — Other Ambulatory Visit: Payer: Self-pay | Admitting: *Deleted

## 2022-01-16 ENCOUNTER — Ambulatory Visit (INDEPENDENT_AMBULATORY_CARE_PROVIDER_SITE_OTHER): Payer: 59 | Admitting: Obstetrics and Gynecology

## 2022-01-16 ENCOUNTER — Other Ambulatory Visit: Payer: 59

## 2022-01-16 VITALS — BP 134/80 | HR 87 | Wt 222.0 lb

## 2022-01-16 DIAGNOSIS — Z6791 Unspecified blood type, Rh negative: Secondary | ICD-10-CM | POA: Diagnosis not present

## 2022-01-16 DIAGNOSIS — O26892 Other specified pregnancy related conditions, second trimester: Secondary | ICD-10-CM

## 2022-01-16 DIAGNOSIS — Z3482 Encounter for supervision of other normal pregnancy, second trimester: Secondary | ICD-10-CM | POA: Diagnosis not present

## 2022-01-16 DIAGNOSIS — O219 Vomiting of pregnancy, unspecified: Secondary | ICD-10-CM

## 2022-01-16 DIAGNOSIS — Z3A26 26 weeks gestation of pregnancy: Secondary | ICD-10-CM

## 2022-01-16 DIAGNOSIS — Z23 Encounter for immunization: Secondary | ICD-10-CM

## 2022-01-16 DIAGNOSIS — O26899 Other specified pregnancy related conditions, unspecified trimester: Secondary | ICD-10-CM | POA: Diagnosis not present

## 2022-01-16 DIAGNOSIS — R102 Pelvic and perineal pain: Secondary | ICD-10-CM

## 2022-01-16 LAB — POCT URINALYSIS DIPSTICK OB
Bilirubin, UA: NEGATIVE
Blood, UA: NEGATIVE
Glucose, UA: NEGATIVE
Ketones, UA: 40
Leukocytes, UA: NEGATIVE
Nitrite, UA: NEGATIVE
POC,PROTEIN,UA: NEGATIVE
Spec Grav, UA: 1.015 (ref 1.010–1.025)
Urobilinogen, UA: 0.2 E.U./dL
pH, UA: 6.5 (ref 5.0–8.0)

## 2022-01-16 MED ORDER — RHO D IMMUNE GLOBULIN 1500 UNIT/2ML IJ SOSY
300.0000 ug | PREFILLED_SYRINGE | Freq: Once | INTRAMUSCULAR | Status: AC
  Administered 2022-01-16: 16:00:00 300 ug via INTRAMUSCULAR

## 2022-01-16 NOTE — Patient Instructions (Signed)

## 2022-01-16 NOTE — Progress Notes (Signed)
ROB: Patient noting pelvic pressure. States that it feels like she constantly has to urinate, but when she goes to the restroom, not much is voided. Discussed that it may be fetal pressure on the bladder. Also reports that overall her nausea/vomiting has improved, however had an episode last week that was associated with the room spinning, felt like she was drunk. This has been different from her other episodes. Unclear cause at this time but will continue to monitor.  For 28 week labs today.  Plans to  plans to breastfeed, desires no method for contraception. For Tdap today, signed blood consent. Rhogam administered. Declines flu. RTC in 4 weeks.

## 2022-01-17 LAB — CBC
Hematocrit: 32.7 % — ABNORMAL LOW (ref 34.0–46.6)
Hemoglobin: 10.8 g/dL — ABNORMAL LOW (ref 11.1–15.9)
MCH: 31.3 pg (ref 26.6–33.0)
MCHC: 33 g/dL (ref 31.5–35.7)
MCV: 95 fL (ref 79–97)
Platelets: 301 10*3/uL (ref 150–450)
RBC: 3.45 x10E6/uL — ABNORMAL LOW (ref 3.77–5.28)
RDW: 11.9 % (ref 11.7–15.4)
WBC: 17.2 10*3/uL — ABNORMAL HIGH (ref 3.4–10.8)

## 2022-01-17 LAB — RPR: RPR Ser Ql: NONREACTIVE

## 2022-01-17 LAB — GLUCOSE, 1 HOUR GESTATIONAL: Gestational Diabetes Screen: 98 mg/dL (ref 70–139)

## 2022-01-18 ENCOUNTER — Other Ambulatory Visit: Payer: Self-pay | Admitting: Obstetrics and Gynecology

## 2022-01-18 DIAGNOSIS — O219 Vomiting of pregnancy, unspecified: Secondary | ICD-10-CM

## 2022-01-23 DIAGNOSIS — Z3482 Encounter for supervision of other normal pregnancy, second trimester: Secondary | ICD-10-CM | POA: Diagnosis not present

## 2022-01-23 DIAGNOSIS — Z3483 Encounter for supervision of other normal pregnancy, third trimester: Secondary | ICD-10-CM | POA: Diagnosis not present

## 2022-01-24 ENCOUNTER — Telehealth: Payer: Self-pay

## 2022-01-24 NOTE — Telephone Encounter (Signed)
Spoke with patient per Dr. Logan Bores recommendation. Advised patient to increase fluid and carb intake as well as no prolonged standing. Patient verbalized understanding and will discuss this at next visit.

## 2022-01-29 ENCOUNTER — Telehealth: Payer: Self-pay | Admitting: Obstetrics and Gynecology

## 2022-01-29 NOTE — Telephone Encounter (Signed)
Pt called and stated that she has been vomiting since last night around midnight. Stated she is [redacted] wks pregnant. She was wondering if she needed to be concerned or if there is anything she need to do to ensure the baby stays nourished or if its dangerous to her baby. Pt stated she cannot keep anything down. Please advise.

## 2022-01-30 NOTE — Telephone Encounter (Signed)
Called to check on patient. She said symptoms have resolved. She thinks she may have had food poison. Recommeded Vitamin B6 and Unisom if she started having symptoms again. She verbalized understanding.

## 2022-02-06 ENCOUNTER — Ambulatory Visit (INDEPENDENT_AMBULATORY_CARE_PROVIDER_SITE_OTHER): Payer: 59 | Admitting: Obstetrics and Gynecology

## 2022-02-06 ENCOUNTER — Other Ambulatory Visit: Payer: Self-pay

## 2022-02-06 ENCOUNTER — Encounter: Payer: Self-pay | Admitting: Obstetrics and Gynecology

## 2022-02-06 VITALS — BP 144/86 | HR 93 | Wt 223.1 lb

## 2022-02-06 DIAGNOSIS — Z3483 Encounter for supervision of other normal pregnancy, third trimester: Secondary | ICD-10-CM

## 2022-02-06 DIAGNOSIS — Z3A29 29 weeks gestation of pregnancy: Secondary | ICD-10-CM

## 2022-02-06 LAB — POCT URINALYSIS DIPSTICK OB
Bilirubin, UA: NEGATIVE
Blood, UA: NEGATIVE
Glucose, UA: NEGATIVE
Ketones, UA: NEGATIVE
Leukocytes, UA: NEGATIVE
Nitrite, UA: NEGATIVE
POC,PROTEIN,UA: NEGATIVE
Spec Grav, UA: 1.01 (ref 1.010–1.025)
Urobilinogen, UA: 0.2 E.U./dL
pH, UA: 6 (ref 5.0–8.0)

## 2022-02-06 NOTE — Progress Notes (Signed)
ROB: Patient occasionally lightheaded- ?  Hypoglycemic ?Marland Kitchen  We have discussed this.  She also reports baby is active but not 10 times in 2 hours.  We have discussed the expectations of fetal movement and she feels reassured.

## 2022-02-06 NOTE — Patient Instructions (Signed)
Fetal Movement Counts Patient Name: ________________________________________________ Patient DueDate: ____________________ What is a fetal movement count?  A fetal movement count is the number of times that you feel your baby move during a certain amount of time. This may also be called a fetal kick count. A fetal movement count is recommended for every pregnant woman. You may be askedto start counting fetal movements as early as week 28 of your pregnancy. Pay attention to when your baby is most active. You may notice your baby's sleep and wake cycles. You may also notice things that make your baby move more. You should do a fetal movement count: When your baby is normally most active. At the same time each day. A good time to count movements is while you are resting, after having somethingto eat and drink. How do I count fetal movements? Find a quiet, comfortable area. Sit, or lie down on your side. Write down the date, the start time and stop time, and the number of movements that you felt between those two times. Take this information with you to your health care visits. Write down your start time when you feel the first movement. Count kicks, flutters, swishes, rolls, and jabs. You should feel at least 10 movements. You may stop counting after you have felt 10 movements, or if you have been counting for 2 hours. Write down the stop time. If you do not feel 10 movements in 2 hours, contact your health care provider for further instructions. Your health care provider may want to do additional tests to assess your baby's well-being. Contact a health care provider if: You feel fewer than 10 movements in 2 hours. Your baby is not moving like he or she usually does. Date: ____________ Start time: ____________ Stop time: ____________ Movements:____________ Date: ____________ Start time: ____________ Stop time: ____________ Movements:____________ Date: ____________ Start time: ____________ Stop  time: ____________ Movements:____________ Date: ____________ Start time: ____________ Stop time: ____________ Movements:____________ Date: ____________ Start time: ____________ Stop time: ____________ Movements:____________ Date: ____________ Start time: ____________ Stop time: ____________ Movements:____________ Date: ____________ Start time: ____________ Stop time: ____________ Movements:____________ Date: ____________ Start time: ____________ Stop time: ____________ Movements:____________ Date: ____________ Start time: ____________ Stop time: ____________ Movements:____________ This information is not intended to replace advice given to you by your health care provider. Make sure you discuss any questions you have with your healthcare provider. Document Revised: 07/29/2019 Document Reviewed: 07/29/2019 Elsevier Patient Education  2022 [employer] Inc.  

## 2022-02-06 NOTE — Progress Notes (Signed)
ROB. Patient states fetal movement with pressure. Patient states she has been trying to count fetal movements and would like to discuss the appropriate amount of movement that she should be feeling, provided fetal movement count handout via mychart. Patient states no other questions or concerns.

## 2022-02-13 ENCOUNTER — Other Ambulatory Visit: Payer: Self-pay

## 2022-02-13 ENCOUNTER — Telehealth: Payer: Self-pay | Admitting: Obstetrics and Gynecology

## 2022-02-13 DIAGNOSIS — O219 Vomiting of pregnancy, unspecified: Secondary | ICD-10-CM

## 2022-02-13 MED ORDER — ONDANSETRON HCL 4 MG PO TABS: 4.0000 mg | ORAL_TABLET | Freq: Three times a day (TID) | ORAL | 0 refills | Status: DC | PRN

## 2022-02-13 NOTE — Telephone Encounter (Signed)
Resent to correct pharmacy.

## 2022-02-13 NOTE — Telephone Encounter (Signed)
Pt called and stated that she needs her Zofran RX to be switched to the [facility] on [address] in [place]. Pt stated that the previous pharmacy is no longer accepting her insurance. Please advise.

## 2022-02-15 ENCOUNTER — Other Ambulatory Visit: Payer: Self-pay

## 2022-02-15 DIAGNOSIS — O219 Vomiting of pregnancy, unspecified: Secondary | ICD-10-CM

## 2022-02-15 MED ORDER — ONDANSETRON 4 MG PO TBDP: ORAL_TABLET | ORAL | 0 refills | Status: DC

## 2022-02-27 ENCOUNTER — Encounter: Payer: Self-pay | Admitting: Obstetrics and Gynecology

## 2022-02-27 ENCOUNTER — Ambulatory Visit (INDEPENDENT_AMBULATORY_CARE_PROVIDER_SITE_OTHER): Payer: 59 | Admitting: Obstetrics and Gynecology

## 2022-02-27 ENCOUNTER — Other Ambulatory Visit: Payer: Self-pay

## 2022-02-27 VITALS — BP 139/76 | HR 92 | Wt 224.8 lb

## 2022-02-27 DIAGNOSIS — Z3483 Encounter for supervision of other normal pregnancy, third trimester: Secondary | ICD-10-CM

## 2022-02-27 DIAGNOSIS — Z3A32 32 weeks gestation of pregnancy: Secondary | ICD-10-CM

## 2022-02-27 DIAGNOSIS — O133 Gestational [pregnancy-induced] hypertension without significant proteinuria, third trimester: Secondary | ICD-10-CM | POA: Insufficient documentation

## 2022-02-27 LAB — POCT URINALYSIS DIPSTICK OB
Bilirubin, UA: NEGATIVE
Blood, UA: NEGATIVE
Nitrite, UA: NEGATIVE
POC,PROTEIN,UA: NEGATIVE
Spec Grav, UA: 1.01 (ref 1.010–1.025)
Urobilinogen, UA: 0.2 E.U./dL
pH, UA: 6 (ref 5.0–8.0)

## 2022-02-27 NOTE — Progress Notes (Signed)
ROB: She is doing well, just tired. ?

## 2022-02-27 NOTE — Patient Instructions (Signed)
Hypertension During Pregnancy High blood pressure (hypertension) is when the force of blood pumping through the arteries is high enough to cause problems with your health. Arteries are blood vessels that carry blood from the heart throughout the body. Hypertension during pregnancy can cause problems for you and your baby. It can be mild or severe. There are different types of hypertension that can happen during pregnancy. These include: Chronic hypertension. This happens when you had high blood pressure before you became pregnant, and it continues during the pregnancy. Hypertension that develops before you are [redacted] weeks pregnant and continues during the pregnancy is also called chronic hypertension. If you have chronic hypertension, it will not go away after you have your baby. You will need follow-up visits with your health care provider after you have your baby. Your health care provider may want you to keep taking medicine for your blood pressure. Gestational hypertension. This is hypertension that develops after the 20th week of pregnancy. Gestational hypertension usually goes away after you have your baby, but your health care provider will need to monitor your blood pressure to make sure that it is getting better. Postpartum hypertension. This is high blood pressure that was present before delivery and continues after delivery or that starts after delivery. This usually occurs within 48 hours after childbirth but may occur up to 6 weeks after giving birth. When hypertension during pregnancy is severe, it is a medical emergency that requires treatment right away. How does this affect me? Women who have hypertension during pregnancy have a greater chance of developing hypertension later in life or during future pregnancies. In some cases, hypertension during pregnancy can cause serious complications, such as: Stroke. Heart attack. Injury to other organs, such as kidneys, lungs, or  liver. Preeclampsia. A condition called hemolysis, elevated liver enzymes, and low platelet count (HELLP) syndrome. Convulsions or seizures. Placental abruption. How does this affect my baby? Hypertension during pregnancy can affect your baby. Your baby may: Be born early (prematurely). Not weigh as much as he or she should at birth (low birth weight). Not tolerate labor well, leading to an unplanned cesarean delivery. This condition may also result in a baby's death before birth (stillbirth). What are the risks? There are certain factors that make it more likely for you to develop hypertension during pregnancy. These include: Having hypertension during a previous pregnancy or a family history of hypertension. Being overweight. Being age 35 or older. Being pregnant for the first time. Being pregnant with more than one baby. Becoming pregnant using fertilization methods, such as IVF (in vitro fertilization). Having other medical problems, such as diabetes, kidney disease, or lupus. What can I do to lower my risk? The exact cause of hypertension during pregnancy is not known. You may be able to lower your risk by: Maintaining a healthy weight. Eating a healthy and balanced diet. Following your health care provider's instructions about treating any long-term conditions that you had before becoming pregnant. It is very important to keep all of your prenatal care appointments. Your health care provider will check your blood pressure and make sure that your pregnancy is progressing as expected. If a problem is found, early treatment can prevent complications. How is this treated? Treatment for hypertension during pregnancy varies depending on the type of hypertension you have and how serious it is. If you were taking medicine for high blood pressure before you became pregnant, talk with your health care provider. You may need to change medicine during pregnancy because some   medicines, like ACE  inhibitors, may not be considered safe for your baby. If you have gestational hypertension, your health care provider may order medicine to treat this during pregnancy. If you are at risk for preeclampsia, your health care provider may recommend that you take a low-dose aspirin during your pregnancy. If you have severe hypertension, you may need to be hospitalized so you and your baby can be monitored closely. You may also need to be given medicine to lower your blood pressure. In some cases, if your condition gets worse, you may need to deliver your baby early. Follow these instructions at home: Eating and drinking  Drink enough fluid to keep your urine pale yellow. Avoid caffeine. Lifestyle Do not use any products that contain nicotine or tobacco. These products include cigarettes, chewing tobacco, and vaping devices, such as e-cigarettes. If you need help quitting, ask your health care provider. Do not use alcohol or drugs. Avoid stress as much as possible. Rest and get plenty of sleep. Regular exercise can help to reduce your blood pressure. Ask your health care provider what kinds of exercise are best for you. General instructions Take over-the-counter and prescription medicines only as told by your health care provider. Keep all prenatal and follow-up visits. This is important. Contact a health care provider if: You have symptoms that your health care provider told you may require more treatment or monitoring, such as: Headaches. Nausea or vomiting. Abdominal pain. Dizziness. Light-headedness. Get help right away if: You have symptoms of serious complications, such as: Severe abdominal pain that does not get better with treatment. A severe headache that does not get better, blurred vision, or double vision. Vomiting that does not get better. Sudden, rapid weight gain or swelling in your hands, ankles, or face. Vaginal bleeding. Blood in your urine. Shortness of breath or chest  pain. Weakness on one side of your body or difficulty speaking. Your baby is not moving as much as usual. These symptoms may represent a serious problem that is an emergency. Do not wait to see if the symptoms will go away. Get medical help right away. Call your local emergency services (911 in the [place] Do not drive yourself to the hospital. Summary Hypertension during pregnancy can cause problems for you and your baby. Treatment for hypertension during pregnancy varies depending on the type of hypertension you have and how serious it is. Keep all prenatal and follow-up visits. This is important. Get help right away if you have symptoms of serious complications related to high blood pressure. This information is not intended to replace advice given to you by your health care provider. Make sure you discuss any questions you have with your health care provider. Document Revised: 08/31/2020 Document Reviewed: 08/31/2020 Elsevier Patient Education  2022 Else[employer] Inc.  

## 2022-02-27 NOTE — Progress Notes (Signed)
ROB: Patient notes some fatigue.  Also noting some lower pelvic pain, feels like tightening or stretching. Is intermittent. Discussed comfort measures.  Reviewed vitals, has had mildly elevated BPs over past 2 visits. Discussed likelihood of developing GHTN.  Reviewed red flag symptoms and PIH spectrum. Patient notes she has a BP cuff at home, can begin checking BPs sporadically.  Negative proteinuria.   ? ?Patient has questions about IOL for childcare issues.  Notes she does not have family in the area. Has a babysitter however she has recently started college in another city and so would need advanced notice if she were to be needed to watch patient's other child. Was induced with her last pregnancy at 40 weeks. Discussed policy for IOL, can make exception for childcare issues.  Also, if patient's BPs continue to remain elevated, may require IOL around 39 weeks for HTN. Advised that we would discuss closer to 36 weeks depending on BPs. RTC in 2 weeks, for growth scan in 4 weeks.  ?

## 2022-03-06 ENCOUNTER — Other Ambulatory Visit: Payer: Self-pay | Admitting: Obstetrics and Gynecology

## 2022-03-06 DIAGNOSIS — O219 Vomiting of pregnancy, unspecified: Secondary | ICD-10-CM

## 2022-03-13 ENCOUNTER — Ambulatory Visit (INDEPENDENT_AMBULATORY_CARE_PROVIDER_SITE_OTHER): Payer: 59 | Admitting: Obstetrics and Gynecology

## 2022-03-13 ENCOUNTER — Encounter: Payer: Self-pay | Admitting: Obstetrics and Gynecology

## 2022-03-13 ENCOUNTER — Other Ambulatory Visit: Payer: Self-pay

## 2022-03-13 VITALS — BP 132/70 | HR 98 | Wt 228.0 lb

## 2022-03-13 DIAGNOSIS — Z3483 Encounter for supervision of other normal pregnancy, third trimester: Secondary | ICD-10-CM

## 2022-03-13 DIAGNOSIS — Z3A34 34 weeks gestation of pregnancy: Secondary | ICD-10-CM

## 2022-03-13 LAB — POCT URINALYSIS DIPSTICK OB
Bilirubin, UA: NEGATIVE
Blood, UA: NEGATIVE
Glucose, UA: NEGATIVE
Leukocytes, UA: NEGATIVE
Nitrite, UA: NEGATIVE
POC,PROTEIN,UA: NEGATIVE
Spec Grav, UA: 1.02
Urobilinogen, UA: 0.2 U/dL
pH, UA: 6

## 2022-03-13 NOTE — Progress Notes (Signed)
ROB: She reports she is doing well.  She just feels "tired".  She has been tracking her blood pressures at home- most are 130s over 70s.  This is consistent with her pressures here in the office.  She is scheduled for ultrasound with her next visit.  Cultures next visit. ?

## 2022-03-13 NOTE — Progress Notes (Signed)
ROB. She states fetal movement with pressure. Patient states no questions or concerns at this time.  ? ?

## 2022-03-27 ENCOUNTER — Ambulatory Visit (INDEPENDENT_AMBULATORY_CARE_PROVIDER_SITE_OTHER): Payer: 59

## 2022-03-27 ENCOUNTER — Encounter: Payer: Self-pay | Admitting: Obstetrics and Gynecology

## 2022-03-27 ENCOUNTER — Ambulatory Visit (INDEPENDENT_AMBULATORY_CARE_PROVIDER_SITE_OTHER): Payer: 59 | Admitting: Obstetrics and Gynecology

## 2022-03-27 VITALS — BP 148/78 | HR 85 | Wt 228.5 lb

## 2022-03-27 DIAGNOSIS — Z3A36 36 weeks gestation of pregnancy: Secondary | ICD-10-CM | POA: Diagnosis not present

## 2022-03-27 DIAGNOSIS — Z3685 Encounter for antenatal screening for Streptococcus B: Secondary | ICD-10-CM | POA: Diagnosis not present

## 2022-03-27 DIAGNOSIS — O133 Gestational [pregnancy-induced] hypertension without significant proteinuria, third trimester: Secondary | ICD-10-CM

## 2022-03-27 DIAGNOSIS — Z113 Encounter for screening for infections with a predominantly sexual mode of transmission: Secondary | ICD-10-CM | POA: Diagnosis not present

## 2022-03-27 DIAGNOSIS — R69 Illness, unspecified: Secondary | ICD-10-CM | POA: Diagnosis not present

## 2022-03-27 DIAGNOSIS — Z3483 Encounter for supervision of other normal pregnancy, third trimester: Secondary | ICD-10-CM

## 2022-03-27 LAB — POCT URINALYSIS DIPSTICK OB
Bilirubin, UA: NEGATIVE
Glucose, UA: NEGATIVE
Ketones, UA: NEGATIVE
Leukocytes, UA: NEGATIVE
Nitrite, UA: NEGATIVE
Spec Grav, UA: 1.015 (ref 1.010–1.025)
Urobilinogen, UA: 0.2 E.U./dL
pH, UA: 7 (ref 5.0–8.0)

## 2022-03-27 NOTE — Progress Notes (Signed)
ROB: She is doing well. She has some concerns about having some cramping and sharp back pain that comes and goes. ?

## 2022-03-27 NOTE — Progress Notes (Signed)
ROB: Notes some pelvic cramping. Also still noting swelling of hands and feet. BP mildly elevated again today, no proteinuria. Has questions again about scheduling IOL for childcare reasons. Also with mild GHTN. Will tentatively schedule for 04/12/2022 at midnight. Growth scan done today, 81%ile, normal AFI, vertex. 36 week cultures done. RTC in 1 week.  ?

## 2022-03-27 NOTE — Patient Instructions (Signed)
Labor Induction ?Labor induction is when steps are taken to cause a pregnant woman to begin the labor process. Most women go into labor on their own between 37 weeks and 42 weeks of pregnancy. When this does not happen, or when there is a medical need for labor to begin, steps may be taken to induce, or bring on, labor. ?Labor induction causes a pregnant woman's uterus to contract. It also causes the cervix to soften (ripen), open (dilate), and thin out. Usually, labor is not induced before 39 weeks of pregnancy unless there is a medical reason to do so. ?When is labor induction considered? ?Labor induction may be right for you if: ?Your pregnancy lasts longer than 41 to 42 weeks. ?Your placenta is separating from your uterus (placental abruption). ?You have a rupture of membranes and your labor does not begin. ?You have health problems, like diabetes or high blood pressure (preeclampsia) during your pregnancy. ?Your baby has stopped growing or does not have enough amniotic fluid. ?Before labor induction begins, your health care provider will consider the following factors: ?Your medical condition and the baby's condition. ?How many weeks you have been pregnant. ?How mature the baby's lungs are. ?The condition of your cervix. ?The position of the baby. ?The size of your birth canal. ?Tell a health care provider about: ?Any allergies you have. ?All medicines you are taking, including vitamins, herbs, eye drops, creams, and over-the-counter medicines. ?Any problems you or your family members have had with anesthetic medicines. ?Any surgeries you have had. ?Any blood disorders you have. ?Any medical conditions you have. ?What are the risks? ?Generally, this is a safe procedure. However, problems may occur, including: ?Failed induction. ?Changes in fetal heart rate, such as being too high, too low, or irregular (erratic). ?Infection in the mother or the baby. ?Increased risk of having a cesarean delivery. ?Breaking off  (abruption) of the placenta from the uterus. This is rare. ?Rupture of the uterus. This is very rare. ?Your baby could fail to get enough blood flow or oxygen. This can be life-threatening. ?When induction is needed for medical reasons, the benefits generally outweigh the risks. ?What happens during the procedure? ?During the procedure, your health care provider will use one of these methods to induce labor: ?Stripping the membranes. In this method, the amniotic sac tissue is gently separated from the cervix. This causes the following to happen: ?Your cervix stretches, which in turn causes the release of prostaglandins. ?Prostaglandins induce labor and cause the uterus to contract. ?This procedure is often done in an office visit. You will be sent home to wait for contractions to begin. ?Prostaglandin medicine. This medicine starts contractions and causes the cervix to dilate and ripen. This can be taken by mouth (orally) or by being inserted into the vagina (suppository). ?Inserting a small, thin tube (catheter) with a balloon into the vagina and then expanding the balloon with water to dilate the cervix. ?Breaking the water. In this method, a small instrument is used to make a small hole in the amniotic sac. This eventually causes the amniotic sac to break. Contractions should begin within a few hours. ?Medicine to trigger or strengthen contractions. This medicine is given through an IV that is inserted into a vein in your arm. ?This procedure may vary among health care providers and hospitals. ?Where to find more information ?March of Dimes: www.marchofdimes.org ?The American College of Obstetricians and Gynecologists: www.acog.org ?Summary ?Labor induction causes a pregnant woman's uterus to contract. It also causes the cervix   to soften (ripen), open (dilate), and thin out. ?Labor is usually not induced before 39 weeks of pregnancy unless there is a medical reason to do so. ?When induction is needed for medical  reasons, the benefits generally outweigh the risks. ?Talk with your health care provider about which methods of labor induction are right for you. ?This information is not intended to replace advice given to you by your health care provider. Make sure you discuss any questions you have with your health care provider. ?Document Revised: 09/21/2020 Document Reviewed: 09/21/2020 ?Elsevier Patient Education ? 2022 Elsevier Inc. ? ?

## 2022-03-29 LAB — GC/CHLAMYDIA PROBE AMP
Chlamydia trachomatis, NAA: NEGATIVE
Neisseria Gonorrhoeae by PCR: NEGATIVE

## 2022-03-29 LAB — STREP GP B NAA+RFLX: Strep Gp B NAA+Rflx: NEGATIVE

## 2022-04-01 ENCOUNTER — Encounter: Payer: Self-pay | Admitting: Obstetrics and Gynecology

## 2022-04-02 ENCOUNTER — Telehealth: Payer: Self-pay | Admitting: Obstetrics and Gynecology

## 2022-04-02 ENCOUNTER — Encounter: Payer: Self-pay | Admitting: Obstetrics and Gynecology

## 2022-04-02 NOTE — Telephone Encounter (Signed)
Pt  has concerns following [place] report showing abnormalities. Please return her call to discuss the [place] report. Pt is extremely anxious.  ?

## 2022-04-02 NOTE — Telephone Encounter (Signed)
Ladona Ridgel, Dr. Logan Bores read that report. Please send patient concerns to him as I have not reviewed it.

## 2022-04-04 ENCOUNTER — Other Ambulatory Visit: Payer: 59

## 2022-04-04 ENCOUNTER — Encounter: Payer: Self-pay | Admitting: Obstetrics and Gynecology

## 2022-04-04 ENCOUNTER — Other Ambulatory Visit: Payer: Self-pay

## 2022-04-04 ENCOUNTER — Ambulatory Visit (INDEPENDENT_AMBULATORY_CARE_PROVIDER_SITE_OTHER): Payer: 59 | Admitting: Obstetrics and Gynecology

## 2022-04-04 ENCOUNTER — Ambulatory Visit: Payer: 59 | Attending: Obstetrics

## 2022-04-04 VITALS — BP 120/70 | HR 99 | Wt 234.2 lb

## 2022-04-04 DIAGNOSIS — O133 Gestational [pregnancy-induced] hypertension without significant proteinuria, third trimester: Secondary | ICD-10-CM | POA: Diagnosis not present

## 2022-04-04 DIAGNOSIS — O289 Unspecified abnormal findings on antenatal screening of mother: Secondary | ICD-10-CM | POA: Diagnosis not present

## 2022-04-04 DIAGNOSIS — Z6791 Unspecified blood type, Rh negative: Secondary | ICD-10-CM | POA: Diagnosis not present

## 2022-04-04 DIAGNOSIS — Z3483 Encounter for supervision of other normal pregnancy, third trimester: Secondary | ICD-10-CM | POA: Diagnosis not present

## 2022-04-04 DIAGNOSIS — O26899 Other specified pregnancy related conditions, unspecified trimester: Secondary | ICD-10-CM

## 2022-04-04 DIAGNOSIS — O360130 Maternal care for anti-D [Rh] antibodies, third trimester, not applicable or unspecified: Secondary | ICD-10-CM

## 2022-04-04 DIAGNOSIS — Z363 Encounter for antenatal screening for malformations: Secondary | ICD-10-CM | POA: Diagnosis present

## 2022-04-04 DIAGNOSIS — O283 Abnormal ultrasonic finding on antenatal screening of mother: Secondary | ICD-10-CM | POA: Diagnosis not present

## 2022-04-04 DIAGNOSIS — O26893 Other specified pregnancy related conditions, third trimester: Secondary | ICD-10-CM | POA: Insufficient documentation

## 2022-04-04 DIAGNOSIS — Z3A37 37 weeks gestation of pregnancy: Secondary | ICD-10-CM

## 2022-04-04 LAB — POCT URINALYSIS DIPSTICK OB
Bilirubin, UA: NEGATIVE
Blood, UA: NEGATIVE
Glucose, UA: NEGATIVE
Ketones, UA: NEGATIVE
Leukocytes, UA: NEGATIVE
Nitrite, UA: NEGATIVE
POC,PROTEIN,UA: NEGATIVE
Spec Grav, UA: 1.02 (ref 1.010–1.025)
Urobilinogen, UA: 0.2 E.U./dL
pH, UA: 6 (ref 5.0–8.0)

## 2022-04-04 LAB — FETAL NONSTRESS TEST

## 2022-04-04 NOTE — Progress Notes (Signed)
ROB: Patient feels well.  Has occasional cramps but no real contractions.  NST today reactive.  Discussed most recent ultrasound showing increased abdominal circumference and possible enlarged stomach.  MFM referral discussed.  She will be seen today.  Timing of delivery reviewed-patient scheduled for induction next Friday.  Questions answered. ?

## 2022-04-04 NOTE — Progress Notes (Signed)
ROB. Patient states fetal movement with increased pressure and pelvic pain. She states wanting to review recent ultrasound. NST preformed today. Patient states no questions or concerns at this time.  ? ?

## 2022-04-09 ENCOUNTER — Ambulatory Visit (INDEPENDENT_AMBULATORY_CARE_PROVIDER_SITE_OTHER): Payer: 59 | Admitting: Obstetrics and Gynecology

## 2022-04-09 ENCOUNTER — Encounter: Payer: Self-pay | Admitting: Obstetrics and Gynecology

## 2022-04-09 VITALS — BP 137/90 | HR 105 | Wt 230.3 lb

## 2022-04-09 DIAGNOSIS — Z3A38 38 weeks gestation of pregnancy: Secondary | ICD-10-CM

## 2022-04-09 DIAGNOSIS — Z3483 Encounter for supervision of other normal pregnancy, third trimester: Secondary | ICD-10-CM

## 2022-04-09 LAB — POCT URINALYSIS DIPSTICK OB
Bilirubin, UA: NEGATIVE
Blood, UA: NEGATIVE
Glucose, UA: NEGATIVE
Ketones, UA: NEGATIVE
Leukocytes, UA: NEGATIVE
Nitrite, UA: NEGATIVE
Spec Grav, UA: >=1.03 — AB (ref 1.010–1.025)
Urobilinogen, UA: 0.2 E.U./dL
pH, UA: 6 (ref 5.0–8.0)

## 2022-04-09 NOTE — Progress Notes (Signed)
ROB. Patient states fetal movement with cramping pains. She states the cramping and pain starting last night. Patient states no questions or concerns at this time.   ?

## 2022-04-09 NOTE — Progress Notes (Signed)
ROB: Discussed MFM ultrasound.  Induction scheduled for Friday.  Signs and symptoms of labor discussed.  Induction discussed. ?

## 2022-04-12 ENCOUNTER — Inpatient Hospital Stay: Payer: 59 | Admitting: Anesthesiology

## 2022-04-12 ENCOUNTER — Inpatient Hospital Stay
Admission: EM | Admit: 2022-04-12 | Discharge: 2022-04-13 | DRG: 807 | Disposition: A | Discharge status: Home / Self Care | Payer: 59 | Attending: Obstetrics and Gynecology | Admitting: Obstetrics and Gynecology

## 2022-04-12 ENCOUNTER — Other Ambulatory Visit: Payer: Self-pay

## 2022-04-12 ENCOUNTER — Encounter: Payer: Self-pay | Admitting: Obstetrics and Gynecology

## 2022-04-12 DIAGNOSIS — D649 Anemia, unspecified: Secondary | ICD-10-CM

## 2022-04-12 DIAGNOSIS — O9902 Anemia complicating childbirth: Secondary | ICD-10-CM | POA: Diagnosis present

## 2022-04-12 DIAGNOSIS — O133 Gestational [pregnancy-induced] hypertension without significant proteinuria, third trimester: Secondary | ICD-10-CM

## 2022-04-12 DIAGNOSIS — O360130 Maternal care for anti-D [Rh] antibodies, third trimester, not applicable or unspecified: Secondary | ICD-10-CM

## 2022-04-12 DIAGNOSIS — O26893 Other specified pregnancy related conditions, third trimester: Secondary | ICD-10-CM | POA: Diagnosis present

## 2022-04-12 DIAGNOSIS — O134 Gestational [pregnancy-induced] hypertension without significant proteinuria, complicating childbirth: Principal | ICD-10-CM

## 2022-04-12 DIAGNOSIS — O26899 Other specified pregnancy related conditions, unspecified trimester: Secondary | ICD-10-CM

## 2022-04-12 DIAGNOSIS — Z3A38 38 weeks gestation of pregnancy: Secondary | ICD-10-CM

## 2022-04-12 DIAGNOSIS — O139 Gestational [pregnancy-induced] hypertension without significant proteinuria, unspecified trimester: Principal | ICD-10-CM

## 2022-04-12 DIAGNOSIS — D509 Iron deficiency anemia, unspecified: Secondary | ICD-10-CM | POA: Diagnosis present

## 2022-04-12 DIAGNOSIS — O99013 Anemia complicating pregnancy, third trimester: Secondary | ICD-10-CM

## 2022-04-12 DIAGNOSIS — Z6791 Unspecified blood type, Rh negative: Secondary | ICD-10-CM | POA: Diagnosis not present

## 2022-04-12 DIAGNOSIS — Z3490 Encounter for supervision of normal pregnancy, unspecified, unspecified trimester: Secondary | ICD-10-CM

## 2022-04-12 HISTORY — DX: Other specified health status: Z78.9

## 2022-04-12 LAB — CBC
HCT: 28.7 % — ABNORMAL LOW (ref 36.0–46.0)
Hemoglobin: 9.1 g/dL — ABNORMAL LOW (ref 12.0–15.0)
MCH: 28.2 pg (ref 26.0–34.0)
MCHC: 31.7 g/dL (ref 30.0–36.0)
MCV: 88.9 fL (ref 80.0–100.0)
Platelets: 280 10*3/uL (ref 150–400)
RBC: 3.23 MIL/uL — ABNORMAL LOW (ref 3.87–5.11)
RDW: 13.3 % (ref 11.5–15.5)
WBC: 15.5 10*3/uL — ABNORMAL HIGH (ref 4.0–10.5)
nRBC: 0 % (ref 0.0–0.2)

## 2022-04-12 LAB — TYPE AND SCREEN
ABO/RH(D): A NEG
Antibody Screen: NEGATIVE

## 2022-04-12 LAB — RPR: RPR Ser Ql: NONREACTIVE

## 2022-04-12 MED ORDER — WITCH HAZEL-GLYCERIN EX PADS
1.0000 "application " | MEDICATED_PAD | CUTANEOUS | Status: DC | PRN
  Administered 2022-04-12: 1 via TOPICAL
  Filled 2022-04-12: qty 100

## 2022-04-12 MED ORDER — OXYTOCIN 10 UNIT/ML IJ SOLN
INTRAMUSCULAR | Status: AC
  Filled 2022-04-12: qty 2

## 2022-04-12 MED ORDER — MISOPROSTOL 25 MCG QUARTER TABLET
25.0000 ug | ORAL_TABLET | ORAL | Status: DC | PRN
  Administered 2022-04-12: 25 ug via VAGINAL
  Filled 2022-04-12: qty 1

## 2022-04-12 MED ORDER — PRENATAL MULTIVITAMIN CH
1.0000 | ORAL_TABLET | Freq: Every day | ORAL | Status: DC
  Administered 2022-04-13: 1 via ORAL
  Filled 2022-04-12: qty 1

## 2022-04-12 MED ORDER — ACETAMINOPHEN 325 MG PO TABS
650.0000 mg | ORAL_TABLET | ORAL | Status: DC | PRN
  Filled 2022-04-12: qty 2

## 2022-04-12 MED ORDER — OXYTOCIN BOLUS FROM INFUSION: 333.0000 mL | Freq: Once | INTRAVENOUS | Status: AC

## 2022-04-12 MED ORDER — DIBUCAINE (PERIANAL) 1 % EX OINT
1.0000 | TOPICAL_OINTMENT | CUTANEOUS | Status: DC | PRN
Start: 2022-04-12 — End: 2022-04-13
  Administered 2022-04-12: 1 via RECTAL
  Filled 2022-04-12: qty 28

## 2022-04-12 MED ORDER — ONDANSETRON HCL 4 MG/2ML IJ SOLN: 4.0000 mg | INTRAMUSCULAR | Status: DC | PRN

## 2022-04-12 MED ORDER — LIDOCAINE HCL (PF) 1 % IJ SOLN
INTRAMUSCULAR | Status: DC | PRN
  Administered 2022-04-12: 3 mL

## 2022-04-12 MED ORDER — BENZOCAINE-MENTHOL 20-0.5 % EX AERO
1.0000 "application " | INHALATION_SPRAY | CUTANEOUS | Status: DC | PRN
  Administered 2022-04-12: 1 via TOPICAL
  Filled 2022-04-12: qty 56

## 2022-04-12 MED ORDER — ONDANSETRON HCL 4 MG PO TABS: 4.0000 mg | ORAL_TABLET | ORAL | Status: DC | PRN

## 2022-04-12 MED ORDER — BUPIVACAINE HCL (PF) 0.25 % IJ SOLN
INTRAMUSCULAR | Status: DC | PRN
  Administered 2022-04-12 (×2): 4 mL via EPIDURAL

## 2022-04-12 MED ORDER — LACTATED RINGERS IV SOLN: 500.0000 mL | INTRAVENOUS | Status: DC | PRN

## 2022-04-12 MED ORDER — LACTATED RINGERS IV SOLN
500.0000 mL | Freq: Once | INTRAVENOUS | Status: AC
  Administered 2022-04-12: 500 mL via INTRAVENOUS

## 2022-04-12 MED ORDER — MISOPROSTOL 50MCG HALF TABLET
50.0000 ug | ORAL_TABLET | ORAL | Status: DC
Start: 2022-04-12 — End: 2022-04-12
  Administered 2022-04-12: 50 ug via VAGINAL
  Filled 2022-04-12: qty 1

## 2022-04-12 MED ORDER — FENTANYL-BUPIVACAINE-NACL 0.5-0.125-0.9 MG/250ML-% EP SOLN: 12.0000 mL/h | EPIDURAL | Status: DC | PRN

## 2022-04-12 MED ORDER — ONDANSETRON HCL 4 MG/2ML IJ SOLN: 4.0000 mg | Freq: Four times a day (QID) | INTRAMUSCULAR | Status: DC | PRN

## 2022-04-12 MED ORDER — MISOPROSTOL 200 MCG PO TABS
ORAL_TABLET | ORAL | Status: AC
  Filled 2022-04-12: qty 4

## 2022-04-12 MED ORDER — CALCIUM CARBONATE ANTACID 500 MG PO CHEW
2.0000 | CHEWABLE_TABLET | Freq: Two times a day (BID) | ORAL | Status: DC
  Administered 2022-04-12: 400 mg via ORAL

## 2022-04-12 MED ORDER — COCONUT OIL OIL: 1.0000 "application " | TOPICAL_OIL | Status: DC | PRN

## 2022-04-12 MED ORDER — FENTANYL-BUPIVACAINE-NACL 0.5-0.125-0.9 MG/250ML-% EP SOLN
EPIDURAL | Status: AC
  Filled 2022-04-12: qty 250

## 2022-04-12 MED ORDER — LACTATED RINGERS IV SOLN: INTRAVENOUS | Status: DC

## 2022-04-12 MED ORDER — BUTORPHANOL TARTRATE 1 MG/ML IJ SOLN: 1.0000 mg | INTRAMUSCULAR | Status: DC | PRN

## 2022-04-12 MED ORDER — AMMONIA AROMATIC IN INHA
RESPIRATORY_TRACT | Status: AC
  Filled 2022-04-12: qty 10

## 2022-04-12 MED ORDER — SENNOSIDES-DOCUSATE SODIUM 8.6-50 MG PO TABS
2.0000 | ORAL_TABLET | Freq: Every day | ORAL | Status: DC
  Administered 2022-04-13: 2 via ORAL
  Filled 2022-04-12: qty 2

## 2022-04-12 MED ORDER — IBUPROFEN 600 MG PO TABS
600.0000 mg | ORAL_TABLET | Freq: Four times a day (QID) | ORAL | Status: DC
  Administered 2022-04-12 – 2022-04-13 (×4): 600 mg via ORAL
  Filled 2022-04-12 (×5): qty 1

## 2022-04-12 MED ORDER — OXYTOCIN-SODIUM CHLORIDE 30-0.9 UT/500ML-% IV SOLN
INTRAVENOUS | Status: AC
  Administered 2022-04-12: 333 mL via INTRAVENOUS
  Filled 2022-04-12: qty 500

## 2022-04-12 MED ORDER — PHENYLEPHRINE 80 MCG/ML (10ML) SYRINGE FOR IV PUSH (FOR BLOOD PRESSURE SUPPORT): 80.0000 ug | PREFILLED_SYRINGE | INTRAVENOUS | Status: DC | PRN

## 2022-04-12 MED ORDER — OXYTOCIN-SODIUM CHLORIDE 30-0.9 UT/500ML-% IV SOLN: 1.0000 m[IU]/min | INTRAVENOUS | Status: DC

## 2022-04-12 MED ORDER — FENTANYL-BUPIVACAINE-NACL 0.5-0.125-0.9 MG/250ML-% EP SOLN
EPIDURAL | Status: DC | PRN
  Administered 2022-04-12: 12 mL/h via EPIDURAL

## 2022-04-12 MED ORDER — LIDOCAINE HCL (PF) 1 % IJ SOLN
30.0000 mL | INTRAMUSCULAR | Status: DC | PRN
  Filled 2022-04-12: qty 30

## 2022-04-12 MED ORDER — LIDOCAINE HCL (PF) 1 % IJ SOLN
INTRAMUSCULAR | Status: AC
  Filled 2022-04-12: qty 30

## 2022-04-12 MED ORDER — DIPHENHYDRAMINE HCL 50 MG/ML IJ SOLN: 12.5000 mg | INTRAMUSCULAR | Status: DC | PRN

## 2022-04-12 MED ORDER — SIMETHICONE 80 MG PO CHEW
80.0000 mg | CHEWABLE_TABLET | ORAL | Status: DC | PRN
  Administered 2022-04-12: 80 mg via ORAL
  Filled 2022-04-12: qty 1

## 2022-04-12 MED ORDER — TERBUTALINE SULFATE 1 MG/ML IJ SOLN: 0.2500 mg | Freq: Once | INTRAMUSCULAR | Status: DC | PRN

## 2022-04-12 MED ORDER — CALCIUM CARBONATE ANTACID 500 MG PO CHEW
CHEWABLE_TABLET | ORAL | Status: AC
Start: 2022-04-12 — End: 2022-04-12
  Filled 2022-04-12: qty 2

## 2022-04-12 MED ORDER — ACETAMINOPHEN 325 MG PO TABS
650.0000 mg | ORAL_TABLET | ORAL | Status: DC | PRN
  Administered 2022-04-12: 650 mg via ORAL

## 2022-04-12 MED ORDER — ZOLPIDEM TARTRATE 5 MG PO TABS: 5.0000 mg | ORAL_TABLET | Freq: Every evening | ORAL | Status: DC | PRN

## 2022-04-12 MED ORDER — DIPHENHYDRAMINE HCL 25 MG PO CAPS: 25.0000 mg | ORAL_CAPSULE | Freq: Four times a day (QID) | ORAL | Status: DC | PRN

## 2022-04-12 MED ORDER — OXYTOCIN-SODIUM CHLORIDE 30-0.9 UT/500ML-% IV SOLN: 2.5000 [IU]/h | INTRAVENOUS | Status: DC

## 2022-04-12 MED ORDER — EPHEDRINE 5 MG/ML INJ: 10.0000 mg | INTRAVENOUS | Status: DC | PRN

## 2022-04-12 MED ORDER — LIDOCAINE-EPINEPHRINE (PF) 1.5 %-1:200000 IJ SOLN
INTRAMUSCULAR | Status: DC | PRN
  Administered 2022-04-12: 3 mL via EPIDURAL

## 2022-04-12 NOTE — Anesthesia Preprocedure Evaluation (Signed)
Anesthesia Evaluation  ?Patient identified by MRN, date of birth, ID band ?Patient awake ? ? ? ?Reviewed: ?Allergy & Precautions, NPO status , Patient's Chart, lab work & pertinent test results ? ?Airway ?Mallampati: II ? ?TM Distance: >3 FB ?Neck ROM: full ? ? ? Dental ? ?(+) Teeth Intact ?  ?Pulmonary ?neg pulmonary ROS,  ?  ?Pulmonary exam normal ?breath sounds clear to auscultation ? ? ? ? ? ? Cardiovascular ?Exercise Tolerance: Good ?hypertension, negative cardio ROS ?Normal cardiovascular exam ?Rhythm:Regular Rate:Normal ? ? ?  ?Neuro/Psych ?negative neurological ROS ? negative psych ROS  ? GI/Hepatic ?negative GI ROS, Neg liver ROS,   ?Endo/Other  ?negative endocrine ROS ? Renal/GU ?negative Renal ROS  ?negative genitourinary ?  ?Musculoskeletal ? ? Abdominal ?Normal abdominal exam  (+)   ?Peds ?negative pediatric ROS ?(+)  Hematology ?negative hematology ROS ?(+)   ?Anesthesia Other Findings ?Past Medical History: ?No date: Medical history non-contributory ? ?History reviewed. No pertinent surgical history. ? ?BMI   ? Body Mass Index: 38.32 kg/m?  ?  ? ? Reproductive/Obstetrics ?(+) Pregnancy ? ?  ? ? ? ? ? ? ? ? ? ? ? ? ? ?  ?  ? ? ? ? ? ? ? ? ?Anesthesia Physical ?Anesthesia Plan ? ?ASA: 2 ? ?Anesthesia Plan: Epidural  ? ?Post-op Pain Management:   ? ?Induction:  ? ?PONV Risk Score and Plan:  ? ?Airway Management Planned: Natural Airway ? ?Additional Equipment:  ? ?Intra-op Plan:  ? ?Post-operative Plan:  ? ?Informed Consent: I have reviewed the patients History and Physical, chart, labs and discussed the procedure including the risks, benefits and alternatives for the proposed anesthesia with the patient or authorized representative who has indicated his/her understanding and acceptance.  ? ? ? ? ? ?Plan Discussed with: CRNA and Surgeon ? ?Anesthesia Plan Comments:   ? ? ? ? ? ? ?Anesthesia Quick Evaluation ? ?

## 2022-04-12 NOTE — H&P (Signed)
? ? ? ?History and Physical ? ? ?HPI ? ?Angela Shelton is a 31 y.o. G2P1001 at [redacted]w[redacted]d Estimated Date of Delivery: 04/20/22 who is being admitted for induction of labor for Gestational hypertension. ? ? ?OB History ? ?OB History  ?Gravida Para Term Preterm AB Living  ?2 1 1  0 0 1  ?SAB IAB Ectopic Multiple Live Births  ?0 0 0 0 0  ?  ?# Outcome Date GA Lbr Len/2nd Weight Sex Delivery Anes PTL Lv  ?2 Current           ?1 Term 01/29/16 [redacted]w[redacted]d / 01:01 2910 g F Vag-Spont EPI    ?   Name: Angela Shelton  ?   Apgar1: 8  Apgar5: 9  ? ? ?PROBLEM LIST ? ?Pregnancy complications or risks: ?Patient Active Problem List  ? Diagnosis Date Noted  ? Gestational hypertension affecting second pregnancy 04/12/2022  ? Gestational hypertension, third trimester 02/27/2022  ? Rh negative state in antepartum period 07/21/2015  ? ? ?Prenatal labs and studies: ?ABO, Rh: --/--/A NEG (04/21 0113) ?Antibody: NEG (04/21 0113) ?Rubella: 3.23 (10/10 1444) ?RPR: Non Reactive (01/25 1507)  ?HBsAg: Negative (10/10 1444)  ?HIV: Non Reactive (10/10 1444)  ?GBS:--/Negative (04/05 1559) ? ? ?Past Medical History:  ?Diagnosis Date  ? Medical history non-contributory   ? ? ? ?History reviewed. No pertinent surgical history. ? ? ?Medications   ? ?Current Discharge Medication List  ?  ? ?CONTINUE these medications which have NOT CHANGED  ? Details  ?ondansetron (ZOFRAN-ODT) 4 MG disintegrating tablet DISSOLVE 1 TABLET(4 MG) ON THE TONGUE EVERY 8 HOURS AS NEEDED FOR NAUSEA OR VOMITING ?Qty: 18 tablet, Refills: 1  ? Comments: DX Code Needed  . ?Associated Diagnoses: Nausea and vomiting of pregnancy, antepartum  ?  ?Prenatal Vit-Fe Fumarate-FA (MULTIVITAMIN-PRENATAL) 27-0.8 MG TABS tablet Take 1 tablet by mouth daily at 12 noon.  ?  ?  ? ? ? ?Allergies ? ?Penicillins ? ?Review of Systems ? ?Pertinent items noted in HPI and remainder of comprehensive ROS otherwise negative. ? ?Physical Exam ? ?BP 127/81 (BP Location: Right Arm)   Pulse 82   Temp 98.4 ?F (36.9 ?C)  (Oral)   Resp 14   Ht 5\' 5"  (1.651 m)   Wt 104.5 kg   LMP 07/19/2021 (Approximate)   BMI 38.32 kg/m?  ? ?Lungs:  CTA B ?Cardio: RRR without M/R/G ?Abd: Soft, gravid, NT ?Presentation: cephalic ?EXT: No C/C/ 1+ Edema ?DTRs: 2+ B ?CERVIX: Dilation: 1 ?Effacement (%): 50 ?Cervical Position: Posterior ?Station: Ballotable ?Presentation: Vertex ?Exam by:: [place] Guptill RN ? ?Exam at @ 0700 = 3cm 50% ?AROM performed  - clear fluid noted ?50 mcg Misoprostol placed ? ?See Prenatal records for more detailed PE. ? ?  ? ?FHR:  ?Variability: Good {> 6 bpm) ? ?Toco: ?Uterine Contractions: Q2-6 min, mild ? ?Test Results ? ?Results for orders placed or performed during the hospital encounter of 04/12/22 (from the past 24 hour(s))  ?CBC     Status: Abnormal  ? Collection Time: 04/12/22  1:13 AM  ?Result Value Ref Range  ? WBC 15.5 (H) 4.0 - 10.5 K/uL  ? RBC 3.23 (L) 3.87 - 5.11 MIL/uL  ? Hemoglobin 9.1 (L) 12.0 - 15.0 g/dL  ? HCT 28.7 (L) 36.0 - 46.0 %  ? MCV 88.9 80.0 - 100.0 fL  ? MCH 28.2 26.0 - 34.0 pg  ? MCHC 31.7 30.0 - 36.0 g/dL  ? RDW 13.3 11.5 - 15.5 %  ? Platelets  280 150 - 400 K/uL  ? nRBC 0.0 0.0 - 0.2 %  ?Type and screen [place]     Status: None  ? Collection Time: 04/12/22  1:13 AM  ?Result Value Ref Range  ? ABO/RH(D) A NEG   ? Antibody Screen NEG   ? Sample Expiration    ?  04/15/2022,2359 ?Performed at [facility], [address], [place], [place] [address] ?  ? ?Group B Strep negative ? ?Assessment ? ? G2P1001 at [redacted]w[redacted]d Estimated Date of Delivery: 04/20/22  ?The fetus is reassuring.  ? ?Patient Active Problem List  ? Diagnosis Date Noted  ? Gestational hypertension affecting second pregnancy 04/12/2022  ? Gestational hypertension, third trimester 02/27/2022  ? Rh negative state in antepartum period 07/21/2015  ? ? ?Plan ? ?1. Admit to L&D :   ?2. EFM: -- Category 1 ?3. Stadol or Epidural if desired.   ?4. Admission labs  ?5. Expect vaginal delivery ? ?Finis Bud,  M.D. ?04/12/2022 ?7:25 AM ? ?

## 2022-04-12 NOTE — Anesthesia Procedure Notes (Addendum)
Epidural ?Patient location during procedure: OB ?Start time: 04/12/2022 9:25 AM ?End time: 04/12/2022 9:29 AM ? ?Staffing ?Anesthesiologist: Darleene Cleaver, Gerrit Heck, MD ?Resident/CRNA: Stormy Fabian, CRNA ?Performed: resident/CRNA  ? ?Preanesthetic Checklist ?Completed: patient identified, IV checked, site marked, risks and benefits discussed, surgical consent, monitors and equipment checked and pre-op evaluation ? ?Epidural ?Patient position: sitting ?Prep: ChloraPrep ?Patient monitoring: heart rate, continuous pulse ox and blood pressure ?Approach: midline ?Location: L3-L4 ?Injection technique: LOR saline ? ?Needle:  ?Needle type: Tuohy  ?Needle gauge: 17 G ?Needle length: 9 cm and 9 ?Needle insertion depth: 6.5 cm ?Catheter type: closed end flexible ?Catheter size: 19 Gauge ?Catheter at skin depth: 12 cm ?Test dose: negative and 1.5% lidocaine with Epi 1:200 K ? ?Assessment ?Events: blood not aspirated, injection not painful, no injection resistance, no paresthesia and negative IV test ? ?Additional Notes ?1 attempt ?Pt. Evaluated and documentation done after procedure finished. ?Patient identified. Risks/Benefits/Options discussed with patient including but not limited to bleeding, infection, nerve damage, paralysis, failed block, incomplete pain control, headache, blood pressure changes, nausea, vomiting, reactions to medication both or allergic, itching and postpartum back pain. Confirmed with bedside nurse the patient's most recent platelet count. Confirmed with patient that they are not currently taking any anticoagulation, have any bleeding history or any family history of bleeding disorders. Patient expressed understanding and wished to proceed. All questions were answered. Sterile technique was used throughout the entire procedure. Please see nursing notes for vital signs. Test dose was given through epidural catheter and negative prior to continuing to dose epidural or start infusion. Warning signs of  high block given to the patient including shortness of breath, tingling/numbness in hands, complete motor block, or any concerning symptoms with instructions to call for help. Patient was given instructions on fall risk and not to get out of bed. All questions and concerns addressed with instructions to call with any issues or inadequate analgesia.   ? ?Patient tolerated the insertion well without immediate complications.Reason for block:procedure for pain ? ? ? ?

## 2022-04-12 NOTE — Lactation Note (Addendum)
This note was copied from a baby's chart. ?Lactation Consultation Note ? ?Patient Name: Angela Shelton ?Today's Date: 04/12/2022 ?Reason for consult: L&D Initial assessment;Early term 37-38.6wks ?Age:29 hours ? ?Maternal Data ?Has patient been taught Hand Expression?: Yes ?Does the patient have breastfeeding experience prior to this delivery?: Yes ?How long did the patient breastfeed?: 1 yr ? ?Feeding ?Mother's Current Feeding Choice: Breast Milk ?Mom attempting latchin g baby to left breast in cradle hold, this nipple is inverted, she states she used a nipple shield x 1 yrs with her first, mom given 20 mm nipple shield, baby latched well, but kept pulling off and crying, mom states she eventually settled down and nursed well x 20 min. ?LATCH Score ?Latch: Grasps breast easily, tongue down, lips flanged, rhythmical sucking. (with nipple shield) ? ?Audible Swallowing: A few with stimulation ? ?Type of Nipple: Inverted (left nipple inverted) ? ?Comfort (Breast/Nipple): Filling, red/small blisters or bruises, mild/mod discomfort ? ?Hold (Positioning): Assistance needed to correctly position infant at breast and maintain latch. ? ?LATCH Score: 5 ? ? ?Lactation Tools Discussed/Used ?Tools: Nipple Dorris Carnes ?Nipple shield size: 20 ? ?Interventions ?Interventions: Breast feeding basics reviewed;Assisted with latch;Skin to skin;Hand express;Adjust position;Support pillows;Education ? ?Discharge ?Pump: Personal ? ?Consult Status ?Consult Status: Follow-up from L&D ? ? ? ?Dyann Kief ?04/12/2022, 3:57 PM ? ? ? ?

## 2022-04-13 LAB — CBC
HCT: 29.2 % — ABNORMAL LOW (ref 36.0–46.0)
Hemoglobin: 9.1 g/dL — ABNORMAL LOW (ref 12.0–15.0)
MCH: 28.4 pg (ref 26.0–34.0)
MCHC: 31.2 g/dL (ref 30.0–36.0)
MCV: 91.3 fL (ref 80.0–100.0)
Platelets: 238 10*3/uL (ref 150–400)
RBC: 3.2 MIL/uL — ABNORMAL LOW (ref 3.87–5.11)
RDW: 13.5 % (ref 11.5–15.5)
WBC: 13.7 10*3/uL — ABNORMAL HIGH (ref 4.0–10.5)
nRBC: 0 % (ref 0.0–0.2)

## 2022-04-13 LAB — FETAL SCREEN: Fetal Screen: NEGATIVE

## 2022-04-13 MED ORDER — DOCUSATE SODIUM 100 MG PO CAPS: 100.0000 mg | ORAL_CAPSULE | Freq: Two times a day (BID) | ORAL | 1 refills | Status: DC | PRN

## 2022-04-13 MED ORDER — IBUPROFEN 600 MG PO TABS: 600.0000 mg | ORAL_TABLET | Freq: Four times a day (QID) | ORAL | 1 refills | Status: DC | PRN

## 2022-04-13 MED ORDER — RHO D IMMUNE GLOBULIN 1500 UNIT/2ML IJ SOSY
300.0000 ug | PREFILLED_SYRINGE | Freq: Once | INTRAMUSCULAR | Status: AC
  Administered 2022-04-13: 300 ug via INTRAVENOUS
  Filled 2022-04-13: qty 2

## 2022-04-13 MED ORDER — FERROUS SULFATE 325 (65 FE) MG PO TABS: 325.0000 mg | ORAL_TABLET | Freq: Every day | ORAL | 0 refills | Status: DC

## 2022-04-13 NOTE — Discharge Summary (Signed)
? ?  Postpartum Discharge Summary ? ? ?   ?Patient Name: Angela Shelton ?DOB: 09/18/1991 ?MRN: [id number] ? ?Date of admission: 04/12/2022 ?Delivery date:04/12/2022  ?Delivering provider: Rubie Maid  ?Date of discharge: 04/13/2022 ? ?Admitting diagnosis: Gestational hypertension affecting second pregnancy [O13.9] ?Intrauterine pregnancy: [redacted]w[redacted]d    ?Secondary diagnosis:  Principal Problem: ?  Gestational hypertension affecting second pregnancy ?Active Problems: ?  Rh negative state in antepartum period ? ?Additional problems: Anemia of pregnancy (iron deficiency)    ?Discharge diagnosis: Term Pregnancy Delivered, Gestational Hypertension, and Anemia                                              ?Post partum procedures: None ?Augmentation: AROM and Cytotec ?Complications: None ? ?Hospital course: Induction of Labor With Vaginal Delivery   ?31y.o. yo G2P1001 at 32w0das admitted to the hospital 04/12/2022 for induction of labor.  Indication for induction: Gestational hypertension.  Patient had an uncomplicated labor course as follows: ?Membrane Rupture Time/Date: 6:43 AM ,04/12/2022   ?Delivery Method:Vaginal, Spontaneous  ?Episiotomy: None  ?Lacerations:  1st degree;Perineal  ?Details of delivery can be found in separate delivery note.  Patient had a routine postpartum course. Patient is discharged home 04/13/22. ? ?Newborn Data: ?Birth date:04/12/2022  ?Birth time:1:15 PM  ?Gender:Female  ?Living status:Living  ?Apgars:5 ,9  ?Weight:3820 g  ? ?Magnesium Sulfate received: No ?BMZ received: No ?Rhophylac:No ?MMR:No ?T-DaP:Given prenatally ?Flu: No ?Transfusion:No ? ?Physical exam  ?Vitals:  ? 04/12/22 2113 04/13/22 0102 04/13/22 0358 04/13/22 0830  ?BP: (!) 113/54 128/84 116/64 122/80  ?Pulse: 76 90 82 77  ?Resp: _0 ?Temp: 98.3 ?F (36.8 ?C) 98.1 ?F (36.7 ?C) 98 ?F (36.7 ?C) 98.1 ?F (36.7 ?C)  ?TempSrc: Oral Oral Oral Oral  ?SpO2: 99% 100% 99% 100%  ?Weight:      ?Height:      ? ?General: alert, cooperative, and no  distress ?Lochia: appropriate ?Uterine Fundus: firm ?Incision: N/A ?DVT Evaluation: No evidence of DVT seen on physical exam. ?Negative Homan's sign. ?No cords or calf tenderness. ?No significant calf/ankle edema. ? ?Labs: ?Lab Results  ?Component Value Date  ? WBC 13.7 (H) 04/13/2022  ? HGB 9.1 (L) 04/13/2022  ? HCT 29.2 (L) 04/13/2022  ? MCV 91.3 04/13/2022  ? PLT 238 04/13/2022  ? ? ?Edinburgh Score:  ? ?  04/13/2022  ?  1:44 PM  ?Edinburgh Postnatal Depression Scale Screening Tool  ?I have been able to laugh and see the funny side of things. 0  ?I have looked forward with enjoyment to things. 0  ?I have blamed myself unnecessarily when things went wrong. 0  ?I have been anxious or worried for no good reason. 0  ?I have felt scared or panicky for no good reason. 0  ?Things have been getting on top of me. 0  ?I have been so unhappy that I have had difficulty sleeping. 0  ?I have felt sad or miserable. 0  ?I have been so unhappy that I have been crying. 0  ?The thought of harming myself has occurred to me. 0  ?Edinburgh Postnatal Depression Scale Total 0  ?  ? ? ? ? ?After visit meds:  ?Allergies as of 04/13/2022   ? ?   Reactions  ? Penicillins Nausea And Vomiting  ? All pcn's  ? ?  ? ?  ?  Medication List  ?  ? ?STOP taking these medications   ? ?ondansetron 4 MG disintegrating tablet ?Commonly known as: ZOFRAN-ODT ?  ? ?  ? ?TAKE these medications   ? ?docusate sodium 100 MG capsule ?Commonly known as: COLACE ?Take 1 capsule (100 mg total) by mouth 2 (two) times daily as needed for mild constipation. ?  ?ferrous sulfate 325 (65 FE) MG tablet ?Commonly known as: FerrouSul ?Take 1 tablet (325 mg total) by mouth daily with breakfast. ?  ?ibuprofen 600 MG tablet ?Commonly known as: ADVIL ?Take 1 tablet (600 mg total) by mouth every 6 (six) hours as needed. ?  ?multivitamin-prenatal 27-0.8 MG Tabs tablet ?Take 1 tablet by mouth daily at 12 noon. ?  ? ?  ? ? ? ?Discharge home in stable condition ?Infant Feeding:  Breast ?Infant Disposition:home with mother ?Discharge instruction: per After Visit Summary and Postpartum booklet. ?Activity: Advance as tolerated. Pelvic rest for 6 weeks.  ?Diet: routine diet ?Anticipated Birth Control:  None ?Postpartum Appointment:6 weeks ?Additional Postpartum F/U: Postpartum Depression checkup ?Future Appointments:No future appointments. ?Follow up Visit: ? Follow-up Information   ? ? Rubie Maid, MD Follow up.   ?Specialties: Obstetrics and Gynecology, Radiology ?Why: 2 weeks postpartum televisit mood check ?6 weeks postpartum visit ?Contact information: ?[address] ?[address] ?[place] [address] ?[phone] ? ? ?  ?  ? ?  ?  ? ?  ? ? ? ?  ? ?04/13/2022 ?Rubie Maid, MD ? ? ?

## 2022-04-13 NOTE — Progress Notes (Signed)
Discharge instructions reviewed and all questions answered. Pt stable at the time of discharge home with spouse. Follow-up care reviewed.  ?

## 2022-04-13 NOTE — Progress Notes (Addendum)
Post Partum Day # 1, s/p SVD.  Gestational HTN (no meds) ? ?Subjective: ?no complaints, up ad lib, voiding, and tolerating PO. Doing well with breastfeeding. Notes bleeding is light. ? ?Objective: ?Temp:  [98 ?F (36.7 ?C)-98.4 ?F (36.9 ?C)] 98.1 ?F (36.7 ?C) (04/22 0830) ?Pulse Rate:  [76-105] 77 (04/22 0830) ?Resp:  [18-20] 20 (04/22 0830) ?BP: (100-135)/(54-86) 122/80 (04/22 0830) ?SpO2:  [98 %-100 %] 100 % (04/22 0830) ? ?Physical Exam:  ?General: alert and no distress  ?Lungs: clear to auscultation bilaterally ?Breasts: normal appearance, no masses or tenderness ?Heart: regular rate and rhythm, S1, S2 normal, no murmur, click, rub or gallop ?Abdomen: soft, non-tender; bowel sounds normal; no masses,  no organomegaly ?Pelvis: Lochia: appropriate, Uterine Fundus: firm ?Extremities: DVT Evaluation: No evidence of DVT seen on physical exam. Negative Homan's sign. No cords or calf tenderness. No significant calf/ankle edema. ? ? ?Recent Labs  ?  04/12/22 ?0113 04/13/22 ?[id number]  ?HGB 9.1* 9.1*  ?HCT 28.7* 29.2*  ? ? ?Assessment/Plan: ?Doing well postpartum ?Breastfeeding, doing well.  ?Contraception: None ?Mild anemia of pregnancy, asymptomatic. Can treat with PO iron.  ?Rhogam postpartum as indicated.  ?GHTN, no meds. Currently well controlled.  ?Discharge home today ? ? LOS: 1 day  ? ?Angela Maid, MD ?[facility] ?04/13/2022 11:41 AM ? ? ? ?

## 2022-04-14 LAB — RHOGAM INJECTION: Unit division: 0

## 2022-04-18 NOTE — Anesthesia Postprocedure Evaluation (Signed)
Anesthesia Post Note ? ?Patient: Angela Shelton ? ?Procedure(s) Performed: AN AD HOC LABOR EPIDURAL ? ?Patient location during evaluation: Mother Baby ?Anesthesia Type: Epidural ?Level of consciousness: awake and awake and alert ?Pain management: satisfactory to patient ?Vital Signs Assessment: post-procedure vital signs reviewed and stable ?Respiratory status: spontaneous breathing ?Cardiovascular status: stable ?Postop Assessment: no headache and no backache ?Anesthetic complications: no ? ? ?No notable events documented. ? ? ?Last Vitals: There were no vitals filed for this visit.  ?Last Pain: There were no vitals filed for this visit. ? ?  ?  ?  ?  ?  ?  ? ?VAN STAVEREN,Jubal Rademaker ? ? ? ? ? ?

## 2022-04-29 NOTE — Progress Notes (Signed)
with  ? ?Virtual Visit via Video Note ? ?I connected with Angela Shelton on 04/29/22 at  11:30 AM EDT by a video enabled telemedicine application and verified that I am speaking with the correct person using two identifiers. ? ?Location: ?Patient: Home ?Provider: Office ?  ?I discussed the limitations of evaluation and management by telemedicine and the availability of in person appointments. The patient expressed understanding and agreed to proceed. ? ?  ?History of Present Illness: ?  ?Angela Shelton is a 31 y.o. G28P1001 female who presents for a 2 week televisit for mood check. She is 2 weeks postpartum following a spontaneous vaginal delivery.  The delivery was at 38 gestational weeks. Pregnancy was complicated by gestational HTN at term.  Postpartum course has been well so far. Baby is feeding by bottle with Simalac Pro Comfort. Bleeding: spotting every now and again. Postpartum depression screening: negative.  EDPS score is 0.  ?  ?  ?The following portions of the patient's history were reviewed and updated as appropriate: allergies, current medications, past family history, past medical history, past social history, past surgical history, and problem list. ?  ?Observations/Objective: ?  ?Height 5\' 4"  (1.626 m), weight 201 lb (91.2 kg), last menstrual period 07/19/2021, unknown if currently breastfeeding. ?Gen App: NAD ?Psych: normal speech, affect. Good mood.  ?  ? ? ?  04/13/2022  ?  1:44 PM  ?Edinburgh Postnatal Depression Scale Screening Tool  ?I have been able to laugh and see the funny side of things. 0  ?I have looked forward with enjoyment to things. 0  ?I have blamed myself unnecessarily when things went wrong. 0  ?I have been anxious or worried for no good reason. 0  ?I have felt scared or panicky for no good reason. 0  ?Things have been getting on top of me. 0  ?I have been so unhappy that I have had difficulty sleeping. 0  ?I have felt sad or miserable. 0  ?I have been so unhappy that I have been crying.  0  ?The thought of harming myself has occurred to me. 0  ?Edinburgh Postnatal Depression Scale Total 0  ?  ? ? ? ?  ?Assessment and Plan: ?  ?1. Encounter for screening for maternal depression ?- Screening negative today. Will rescreen at 6 week postpartum visit. Overall doing well.  ?  ?2. Postpartum state ?- Overall doing well. Continue routine postpartum home care. follow up in 3-4 weeks for final postpartum visit.  ?  ? ?  ?Follow Up Instructions: ?  ?  ?I discussed the assessment and treatment plan with the patient. The patient was provided an opportunity to ask questions and all were answered. The patient agreed with the plan and demonstrated an understanding of the instructions. ?  ?The patient was advised to call back or seek an in-person evaluation if the symptoms worsen or if the condition fails to improve as anticipated. ?  ?I provided 6 minutes of non-face-to-face time during this encounter. ?  ?  ?04/15/2022, MD ?[facility]  ?

## 2022-04-30 ENCOUNTER — Encounter: Payer: Self-pay | Admitting: Obstetrics and Gynecology

## 2022-04-30 ENCOUNTER — Telehealth (INDEPENDENT_AMBULATORY_CARE_PROVIDER_SITE_OTHER): Payer: 59 | Admitting: Obstetrics and Gynecology

## 2022-04-30 VITALS — Ht 64.0 in | Wt 201.0 lb

## 2022-04-30 DIAGNOSIS — Z1332 Encounter for screening for maternal depression: Secondary | ICD-10-CM

## 2022-04-30 NOTE — Patient Instructions (Signed)

## 2022-05-06 ENCOUNTER — Other Ambulatory Visit: Payer: Self-pay | Admitting: Obstetrics and Gynecology

## 2022-05-20 ENCOUNTER — Other Ambulatory Visit: Payer: Self-pay | Admitting: Obstetrics and Gynecology

## 2022-05-22 NOTE — Progress Notes (Unsigned)
OBSTETRICS POSTPARTUM CLINIC PROGRESS NOTE  Subjective:     Angela Shelton is a 31 y.o. G33P1001 female who presents for a postpartum visit. She is 6 weeks postpartum following a spontaneous vaginal delivery. I have fully reviewed the prenatal and intrapartum course. The delivery was at 38.6 gestational weeks.  Pregnancy was complicated by gestational HTN. Anesthesia: epidural. Postpartum course has been going well. Baby's course has been well. Baby is feeding by bottle - Simalac Pro Total Comfort . Bleeding: patient has not resumed menses, with No LMP recorded. Bowel function is normal. Bladder function is normal. Patient is not sexually active. Contraception method desired is NuvaRing vaginal inserts. Postpartum depression screening: negative.  EDPS score is 4.    The following portions of the patient's history were reviewed and updated as appropriate: allergies, current medications, past family history, past medical history, past social history, past surgical history, and problem list.  Review of Systems Pertinent items noted in HPI and remainder of comprehensive ROS otherwise negative.   Objective:    BP 132/86   Pulse 70   Resp 16   Ht 5\' 4"  (1.626 m)   Wt 202 lb 12.8 oz (92 kg)   BMI 34.81 kg/m   General:  alert and no distress   Breasts:  inspection negative, no nipple discharge or bleeding, no masses or nodularity palpable  Lungs: clear to auscultation bilaterally  Heart:  regular rate and rhythm, S1, S2 normal, no murmur, click, rub or gallop  Abdomen: soft, non-tender; bowel sounds normal; no masses,  no organomegaly.     Vulva:  normal  Vagina: normal vagina, no discharge, exudate, lesion, or erythema  Cervix:  no cervical motion tenderness and no lesions  Corpus: normal size, contour, position, consistency, mobility, non-tender  Adnexa:  normal adnexa and no mass, fullness, tenderness  Rectal Exam: Not performed.          Edinburgh Postnatal Depression Scale - 05/24/22  1011       Edinburgh Postnatal Depression Scale:  In the Past 7 Days   I have been able to laugh and see the funny side of things. 0    I have looked forward with enjoyment to things. 0    I have blamed myself unnecessarily when things went wrong. 0    I have been anxious or worried for no good reason. 2    I have felt scared or panicky for no good reason. 0    Things have been getting on top of me. 2    I have been so unhappy that I have had difficulty sleeping. 0    I have felt sad or miserable. 0    I have been so unhappy that I have been crying. 0    The thought of harming myself has occurred to me. 0    Edinburgh Postnatal Depression Scale Total 4              Labs:  Lab Results  Component Value Date   HGB 9.1 (L) 04/13/2022     Assessment:   1. Postpartum care following vaginal delivery   2. Postpartum anemia   3. History of gestational hypertension      Plan:   1. Contraception: NuvaRing vaginal inserts 2. Will check Hgb for h/o postpartum anemia of less than 10.  3. H/o GHTN at term, no meds. BPs currently wnl today.  4. Follow up in:  1 year for annual exam. Also plans to f/u with PCP  for wellness check later this year.      Hildred Laser, MD [facility]

## 2022-05-24 ENCOUNTER — Encounter: Payer: Self-pay | Admitting: Obstetrics and Gynecology

## 2022-05-24 ENCOUNTER — Ambulatory Visit (INDEPENDENT_AMBULATORY_CARE_PROVIDER_SITE_OTHER): Payer: 59 | Admitting: Obstetrics and Gynecology

## 2022-05-24 DIAGNOSIS — O9081 Anemia of the puerperium: Secondary | ICD-10-CM | POA: Diagnosis not present

## 2022-05-24 DIAGNOSIS — Z8759 Personal history of other complications of pregnancy, childbirth and the puerperium: Secondary | ICD-10-CM

## 2022-05-24 MED ORDER — ETONOGESTREL-ETHINYL ESTRADIOL 0.12-0.015 MG/24HR VA RING
VAGINAL_RING | VAGINAL | 4 refills | Status: AC
Start: 2022-05-24 — End: ?

## 2022-05-24 NOTE — Addendum Note (Signed)
Addended by: Fabian November on: 05/24/2022 10:40 AM   Modules accepted: Level of Service

## 2022-05-25 LAB — HEMOGLOBIN AND HEMATOCRIT, BLOOD
Hematocrit: 38.9 % (ref 34.0–46.6)
Hemoglobin: 12.6 g/dL (ref 11.1–15.9)

## 2022-07-09 ENCOUNTER — Other Ambulatory Visit: Payer: Self-pay | Admitting: Obstetrics and Gynecology

## 2022-07-09 DIAGNOSIS — O219 Vomiting of pregnancy, unspecified: Secondary | ICD-10-CM

## 2022-09-26 ENCOUNTER — Encounter: Payer: Self-pay | Admitting: Obstetrics and Gynecology

## 2022-10-24 ENCOUNTER — Ambulatory Visit (INDEPENDENT_AMBULATORY_CARE_PROVIDER_SITE_OTHER): Payer: 59 | Admitting: Obstetrics and Gynecology

## 2022-10-24 ENCOUNTER — Encounter: Payer: Self-pay | Admitting: Obstetrics and Gynecology

## 2022-10-24 VITALS — BP 140/88 | HR 101 | Ht 64.0 in | Wt 208.1 lb

## 2022-10-24 DIAGNOSIS — F419 Anxiety disorder, unspecified: Secondary | ICD-10-CM

## 2022-10-24 DIAGNOSIS — F418 Other specified anxiety disorders: Secondary | ICD-10-CM

## 2022-10-24 DIAGNOSIS — R69 Illness, unspecified: Secondary | ICD-10-CM | POA: Diagnosis not present

## 2022-10-24 DIAGNOSIS — O99345 Other mental disorders complicating the puerperium: Secondary | ICD-10-CM

## 2022-10-24 MED ORDER — SERTRALINE HCL 50 MG PO TABS: 50.0000 mg | ORAL_TABLET | Freq: Every day | ORAL | 1 refills | Status: DC

## 2022-10-24 NOTE — Addendum Note (Signed)
Addended by: Douglass Rivers R on: 10/24/2022 03:07 PM   Modules accepted: Orders

## 2022-10-24 NOTE — Progress Notes (Signed)
HPI:      Angela Shelton is a 31 y.o. G2P1001 who LMP was Patient's last menstrual period was 10/16/2022.  Subjective:   She presents today complaining of feelings of sadness and anxiety.  Although her life is generally going well she feels guilty that she feels sad.  (See testing scores) Using NuvaRing for birth control    Hx: The following portions of the patient's history were reviewed and updated as appropriate:             She  has a past medical history of Medical history non-contributory. She does not have any pertinent problems on file. She  has no past surgical history on file. Her family history includes Diabetes in her sister. She  reports that she has never smoked. She has never used smokeless tobacco. She reports that she does not drink alcohol and does not use drugs. She has a current medication list which includes the following prescription(s): etonogestrel-ethinyl estradiol and sertraline. She is allergic to penicillins.       Review of Systems:  Review of Systems  Constitutional: Denied constitutional symptoms, night sweats, recent illness, fatigue, fever, insomnia and weight loss.  Eyes: Denied eye symptoms, eye pain, photophobia, vision change and visual disturbance.  Ears/Nose/Throat/Neck: Denied ear, nose, throat or neck symptoms, hearing loss, nasal discharge, sinus congestion and sore throat.  Cardiovascular: Denied cardiovascular symptoms, arrhythmia, chest pain/pressure, edema, exercise intolerance, orthopnea and palpitations.  Respiratory: Denied pulmonary symptoms, asthma, pleuritic pain, productive sputum, cough, dyspnea and wheezing.  Gastrointestinal: Denied, gastro-esophageal reflux, melena, nausea and vomiting.  Genitourinary: Denied genitourinary symptoms including symptomatic vaginal discharge, pelvic relaxation issues, and urinary complaints.  Musculoskeletal: Denied musculoskeletal symptoms, stiffness, swelling, muscle weakness and myalgia.   Dermatologic: Denied dermatology symptoms, rash and scar.  Neurologic: Denied neurology symptoms, dizziness, headache, neck pain and syncope.  Psychiatric: See HPI for additional information.  Endocrine: Denied endocrine symptoms including hot flashes and night sweats.   Meds:   Current Outpatient Medications on File Prior to Visit  Medication Sig Dispense Refill   etonogestrel-ethinyl estradiol (NUVARING) 0.12-0.015 MG/24HR vaginal ring Insert vaginally and leave in place for 3 consecutive weeks, then remove for 1 week. 3 each 4   No current facility-administered medications on file prior to visit.      Objective:     Vitals:   10/24/22 1334  BP: (!) 140/88  Pulse: (!) 101   Filed Weights   10/24/22 1334  Weight: 208 lb 1.6 oz (94.4 kg)                        Assessment:    G2P1001 Patient Active Problem List   Diagnosis Date Noted   Gestational hypertension affecting second pregnancy 04/12/2022   Anemia of pregnancy in third trimester 04/12/2022   Gestational hypertension, third trimester 02/27/2022   Rh negative state in antepartum period 07/21/2015     1. Anxiety   2. Postpartum anxiety     She generally feels sadness anxiety and depression.  She does not express concerns for self-harm.   Plan:            1.  Referral to counseling.  2.  Begin Zoloft 50 mg daily.  Orders Orders Placed This Encounter  Procedures   Ambulatory referral to Psychiatry     Meds ordered this encounter  Medications   sertraline (ZOLOFT) 50 MG tablet    Sig: Take 1 tablet (50 mg total) by mouth  daily.    Dispense:  90 tablet    Refill:  1      F/U  Return in about 4 weeks (around 11/21/2022). I spent 16 minutes involved in the care of this patient preparing to see the patient by obtaining and reviewing her medical history (including labs, imaging tests and prior procedures), documenting clinical information in the electronic health record (EHR), counseling and  coordinating care plans, writing and sending prescriptions, ordering tests or procedures and in direct communicating with the patient and medical staff discussing pertinent items from her history and physical exam.  Elonda Husky, M.D. 10/24/2022 2:06 PM

## 2022-10-24 NOTE — Progress Notes (Signed)
Patient presents today for postpartum anxiety. She states she has been feeling anxious, very emotional and mentally exhausted. EPDS:8 GAD7: 9 PHQ9:7. No additional concerns.

## 2022-12-01 ENCOUNTER — Ambulatory Visit
Admission: EM | Admit: 2022-12-01 | Discharge: 2022-12-01 | Disposition: A | Discharge status: Home / Self Care | Payer: 59 | Attending: Urgent Care | Admitting: Urgent Care

## 2022-12-01 DIAGNOSIS — J029 Acute pharyngitis, unspecified: Secondary | ICD-10-CM | POA: Diagnosis not present

## 2022-12-01 DIAGNOSIS — J020 Streptococcal pharyngitis: Secondary | ICD-10-CM

## 2022-12-01 LAB — POCT RAPID STREP A (OFFICE): Rapid Strep A Screen: POSITIVE — AB

## 2022-12-01 MED ORDER — CEFDINIR 300 MG PO CAPS: 300.0000 mg | ORAL_CAPSULE | Freq: Two times a day (BID) | ORAL | 0 refills | Status: AC

## 2022-12-01 NOTE — Discharge Instructions (Addendum)
Follow up here or with your primary care provider if your symptoms are worsening or not improving with treatment.     

## 2022-12-01 NOTE — ED Provider Notes (Signed)
Angela Shelton    CSN: [id number] Arrival date & time: 12/01/22  0809      History   Chief Complaint Chief Complaint  Patient presents with   Sore Throat    HPI Angela Shelton is a 31 y.o. female.    Sore Throat    Presents to urgent care with complaint of sore throat starting yesterday.  Patient endorses recent exposure to strep infection in her home.  Past Medical History:  Diagnosis Date   Medical history non-contributory     Patient Active Problem List   Diagnosis Date Noted   Gestational hypertension affecting second pregnancy 04/12/2022   Anemia of pregnancy in third trimester 04/12/2022   Gestational hypertension, third trimester 02/27/2022   Rh negative state in antepartum period 07/21/2015    History reviewed. No pertinent surgical history.  OB History     Gravida  2   Para  1   Term  1   Preterm      AB      Living  1      SAB      IAB      Ectopic      Multiple  0   Live Births               Home Medications    Prior to Admission medications   Medication Sig Start Date End Date Taking? Authorizing Provider  etonogestrel-ethinyl estradiol (NUVARING) 0.12-0.015 MG/24HR vaginal ring Insert vaginally and leave in place for 3 consecutive weeks, then remove for 1 week. 05/24/22   Hildred Laser, MD  sertraline (ZOLOFT) 50 MG tablet Take 1 tablet (50 mg total) by mouth daily. 10/24/22 04/22/23  Linzie Collin, MD    Family History Family History  Problem Relation Age of Onset   Diabetes Sister    Breast cancer Neg Hx    Colon cancer Neg Hx    Ovarian cancer Neg Hx     Social History Social History   Tobacco Use   Smoking status: Never   Smokeless tobacco: Never  Vaping Use   Vaping Use: Never used  Substance Use Topics   Alcohol use: No   Drug use: No     Allergies   Penicillins   Review of Systems Review of Systems   Physical Exam Triage Vital Signs ED Triage Vitals [12/01/22 0824]  Enc Vitals  Group     BP 138/88     Pulse Rate (!) 104     Resp 17     Temp 97.7 F (36.5 C)     Temp src      SpO2 97 %     Weight      Height      Head Circumference      Peak Flow      Pain Score 0     Pain Loc      Pain Edu?      Excl. in GC?    No data found.  Updated Vital Signs BP 138/88   Pulse (!) 104   Temp 97.7 F (36.5 C)   Resp 17   LMP 11/24/2022 (Approximate)   SpO2 97%   Visual Acuity Right Eye Distance:   Left Eye Distance:   Bilateral Distance:    Right Eye Near:   Left Eye Near:    Bilateral Near:     Physical Exam Vitals reviewed.  Constitutional:      Appearance: She is well-developed.  HENT:  Mouth/Throat:     Pharynx: Posterior oropharyngeal erythema present. No oropharyngeal exudate.     Tonsils: No tonsillar exudate. 2+ on the right. 2+ on the left.  Skin:    General: Skin is warm and dry.  Neurological:     General: No focal deficit present.     Mental Status: She is alert and oriented to person, place, and time.  Psychiatric:        Mood and Affect: Mood normal.        Behavior: Behavior normal.      UC Treatments / Results  Labs (all labs ordered are listed, but only abnormal results are displayed) Labs Reviewed - No data to display  EKG   Radiology No results found.  Procedures Procedures (including critical care time)  Medications Ordered in UC Medications - No data to display  Initial Impression / Assessment and Plan / UC Course  I have reviewed the triage vital signs and the nursing notes.  Pertinent labs & imaging results that were available during my care of the patient were reviewed by me and considered in my medical decision making (see chart for details).   Patient is afebrile here without recent antipyretics. Satting well on room air. Overall is well appearing, well hydrated, without respiratory distress.  Acutely erythematous pharynx with no peritonsillar exudates visible.  Rapid strep is positive.  Prescribing cephalosporin as she has allergy to PCN. Previously tolerated ceftriaxone. Recommending use of OTC medication for symptom control of viral pharyngitis.  Also suggested warm salt water gargles throughout the day.    Final Clinical Impressions(s) / UC Diagnoses   Final diagnoses:  None   Discharge Instructions   None    ED Prescriptions   None    PDMP not reviewed this encounter.   Charma Igo, [place] 12/01/22 [phone]

## 2022-12-01 NOTE — ED Triage Notes (Signed)
Pt. Presents to UC w/ c/o a sore throat since yesterday. Pt. Was recently exposed to strep throat.

## 2023-01-06 DIAGNOSIS — Z681 Body mass index (BMI) 19 or less, adult: Secondary | ICD-10-CM | POA: Diagnosis not present

## 2023-01-06 DIAGNOSIS — J029 Acute pharyngitis, unspecified: Secondary | ICD-10-CM | POA: Diagnosis not present

## 2023-01-07 NOTE — Progress Notes (Unsigned)
This encounter was created in error - please disregard.

## 2023-01-16 DIAGNOSIS — J0300 Acute streptococcal tonsillitis, unspecified: Secondary | ICD-10-CM | POA: Diagnosis not present

## 2023-01-16 DIAGNOSIS — J020 Streptococcal pharyngitis: Secondary | ICD-10-CM | POA: Diagnosis not present

## 2023-03-19 ENCOUNTER — Telehealth: Payer: Self-pay | Admitting: Obstetrics and Gynecology

## 2023-03-19 ENCOUNTER — Other Ambulatory Visit: Payer: Self-pay

## 2023-03-19 DIAGNOSIS — F418 Other specified anxiety disorders: Secondary | ICD-10-CM

## 2023-03-19 DIAGNOSIS — F419 Anxiety disorder, unspecified: Secondary | ICD-10-CM

## 2023-03-19 DIAGNOSIS — O99345 Other mental disorders complicating the puerperium: Secondary | ICD-10-CM

## 2023-03-19 MED ORDER — SERTRALINE HCL 50 MG PO TABS: 50.0000 mg | ORAL_TABLET | Freq: Every day | ORAL | 1 refills | Status: DC

## 2023-03-19 NOTE — Telephone Encounter (Signed)
I contacted the patient via phone. I was contacting her to schedule a month medication follow up with Dr. Amalia Hailey. I had to left voicemail for the patient to call back to be scheduled.

## 2023-03-26 NOTE — Telephone Encounter (Signed)
I contacted the patient via phone. I left voicemail for the patient to call back to be scheduled,

## 2023-06-09 ENCOUNTER — Other Ambulatory Visit: Payer: Self-pay

## 2023-07-07 ENCOUNTER — Ambulatory Visit: Payer: Self-pay

## 2023-09-22 ENCOUNTER — Other Ambulatory Visit: Payer: Self-pay

## 2023-09-22 DIAGNOSIS — Z3044 Encounter for surveillance of vaginal ring hormonal contraceptive device: Secondary | ICD-10-CM

## 2023-09-22 MED ORDER — ETONOGESTREL-ETHINYL ESTRADIOL 0.12-0.015 MG/24HR VA RING: VAGINAL_RING | VAGINAL | 0 refills | Status: DC

## 2023-09-22 NOTE — Telephone Encounter (Signed)
Patient transferred from scheduler. She states she just scheduled her annual for Wed 09/24/23, but needs her etonogestrel-ethinyl estradiol (NUVARING) 0.12-0.015 MG/24HR vaginal ring  refilled. Advised will send refill.

## 2023-09-24 ENCOUNTER — Encounter: Payer: Self-pay | Admitting: Obstetrics and Gynecology

## 2023-09-24 ENCOUNTER — Ambulatory Visit (INDEPENDENT_AMBULATORY_CARE_PROVIDER_SITE_OTHER): Payer: 59 | Admitting: Obstetrics and Gynecology

## 2023-09-24 VITALS — BP 124/83 | HR 69 | Ht 64.0 in | Wt 229.0 lb

## 2023-09-24 DIAGNOSIS — Z3044 Encounter for surveillance of vaginal ring hormonal contraceptive device: Secondary | ICD-10-CM

## 2023-09-24 DIAGNOSIS — Z01419 Encounter for gynecological examination (general) (routine) without abnormal findings: Secondary | ICD-10-CM

## 2023-09-24 MED ORDER — ETONOGESTREL-ETHINYL ESTRADIOL 0.12-0.015 MG/24HR VA RING: VAGINAL_RING | VAGINAL | 11 refills | Status: DC

## 2023-09-24 NOTE — Progress Notes (Signed)
HPI:      [name] is a 32 y.o. G2P1001 who LMP was Patient's last menstrual period was 09/17/2023.  Subjective:   She presents today for her annual examination.  She is currently using NuvaRing for birth control.  She would like to consider other options.  She previously had an IUD which became malpositioned and had to be removed.  She is willing to consider this as another option.  She would also like to discuss the use of OCPs.    Hx: The following portions of the patient's history were reviewed and updated as appropriate:             She  has a past medical history of Medical history non-contributory. She does not have any pertinent problems on file. She  has no past surgical history on file. Her family history includes Diabetes in her sister. She  reports that she has never smoked. She has never used smokeless tobacco. She reports that she does not drink alcohol and does not use drugs. She has a current medication list which includes the following prescription(s): etonogestrel-ethinyl estradiol and sertraline. She is allergic to penicillins.       Review of Systems:  Review of Systems  Constitutional: Denied constitutional symptoms, night sweats, recent illness, fatigue, fever, insomnia and weight loss.  Eyes: Denied eye symptoms, eye pain, photophobia, vision change and visual disturbance.  Ears/Nose/Throat/Neck: Denied ear, nose, throat or neck symptoms, hearing loss, nasal discharge, sinus congestion and sore throat.  Cardiovascular: Denied cardiovascular symptoms, arrhythmia, chest pain/pressure, edema, exercise intolerance, orthopnea and palpitations.  Respiratory: Denied pulmonary symptoms, asthma, pleuritic pain, productive sputum, cough, dyspnea and wheezing.  Gastrointestinal: Denied, gastro-esophageal reflux, melena, nausea and vomiting.  Genitourinary: Denied genitourinary symptoms including symptomatic vaginal discharge, pelvic relaxation issues, and urinary  complaints.  Musculoskeletal: Denied musculoskeletal symptoms, stiffness, swelling, muscle weakness and myalgia.  Dermatologic: Denied dermatology symptoms, rash and scar.  Neurologic: Denied neurology symptoms, dizziness, headache, neck pain and syncope.  Psychiatric: Denied psychiatric symptoms, anxiety and depression.  Endocrine: Denied endocrine symptoms including hot flashes and night sweats.   Meds:   Current Outpatient Medications on File Prior to Visit  Medication Sig Dispense Refill   etonogestrel-ethinyl estradiol (NUVARING) 0.12-0.015 MG/24HR vaginal ring Insert vaginally and leave in place for 3 consecutive weeks, then remove for 1 week. 1 each 0   sertraline (ZOLOFT) 50 MG tablet Take 1 tablet (50 mg total) by mouth daily. 30 tablet 1   No current facility-administered medications on file prior to visit.     Objective:     Vitals:   09/24/23 0915  BP: 124/83  Pulse: 69    Filed Weights   09/24/23 0915  Weight: 229 lb (103.9 kg)              Physical examination General NAD, Conversant  HEENT Atraumatic; Op clear with mmm.  Normo-cephalic.  Anicteric sclerae  Thyroid/Neck Smooth without nodularity or enlargement. Normal ROM.  Neck Supple.  Skin No rashes, lesions or ulceration. Normal palpated skin turgor. No nodularity.  Breasts: No masses or discharge.  Symmetric.  No axillary adenopathy.  Lungs: Clear to auscultation.No rales or wheezes. Normal Respiratory effort, no retractions.  Heart: NSR.  No murmurs or rubs appreciated. No peripheral edema  Abdomen: Soft.  Non-tender.  No masses.  No HSM. No hernia  Extremities: Moves all appropriately.  Normal ROM for age. No lymphadenopathy.  Neuro: Oriented to PPT.  Normal mood. Normal affect.  Pelvic: Deferred      Assessment:    G2P1001 Patient Active Problem List   Diagnosis Date Noted   Gestational hypertension affecting second pregnancy 04/12/2022   Anemia of pregnancy in third trimester 04/12/2022    Gestational hypertension, third trimester 02/27/2022   Rh negative state in antepartum period 07/21/2015     1. Well woman exam with routine gynecological exam   2. Encounter for surveillance of vaginal ring hormonal contraceptive device     Normal exam, patient would like to discuss other forms of birth control.   Plan:            1.  Basic Screening Recommendations The basic screening recommendations for asymptomatic women were discussed with the patient during her visit.  The age-appropriate recommendations were discussed with her and the rational for the tests reviewed.  When I am informed by the patient that another primary care physician has previously obtained the age-appropriate tests and they are up-to-date, only outstanding tests are ordered and referrals given as necessary.  Abnormal results of tests will be discussed with her when all of her results are completed.  Routine preventative health maintenance measures emphasized: Exercise/Diet/Weight control, Tobacco Warnings, Alcohol/Substance use risks and Stress Management 2.  Birth Control I discussed multiple birth control options and methods with the patient.  The risks and benefits of each were reviewed. She would like to return for Mirena IUD placement.  Orders No orders of the defined types were placed in this encounter.   No orders of the defined types were placed in this encounter.         F/U  No follow-ups on file.  Elonda Husky, M.D. 09/24/2023 10:19 AM

## 2023-09-24 NOTE — Progress Notes (Signed)
Patients presents for annual exam today. She states wanting to discuss other birth control options other than the Nuvaring. Up to date on pap smear. She states no other questions or concerns at this time.

## 2023-10-15 ENCOUNTER — Encounter: Payer: Self-pay | Admitting: Obstetrics and Gynecology

## 2023-10-15 ENCOUNTER — Ambulatory Visit: Payer: 59 | Admitting: Obstetrics and Gynecology

## 2023-10-15 VITALS — BP 132/86 | HR 74 | Ht 64.0 in | Wt 229.2 lb

## 2023-10-15 DIAGNOSIS — Z3202 Encounter for pregnancy test, result negative: Secondary | ICD-10-CM | POA: Diagnosis not present

## 2023-10-15 DIAGNOSIS — Z30430 Encounter for insertion of intrauterine contraceptive device: Secondary | ICD-10-CM | POA: Diagnosis not present

## 2023-10-15 LAB — POCT URINE PREGNANCY: Preg Test, Ur: NEGATIVE

## 2023-10-15 MED ORDER — LEVONORGESTREL 20 MCG/DAY IU IUD
1.0000 | INTRAUTERINE_SYSTEM | Freq: Once | INTRAUTERINE | Status: AC
  Administered 2023-10-15: 1 via INTRAUTERINE

## 2023-10-15 NOTE — Progress Notes (Signed)
Patient presents today for IUD insertion. She states no other questions or concerns.   

## 2023-10-15 NOTE — Progress Notes (Signed)
HPI:      Angela Shelton is a 32 y.o. G2P1001 who LMP was Patient's last menstrual period was 09/17/2023.  Subjective:   She presents today for IUD insertion.  She was using NuvaRing but would like to switch to IUD.  She previously had an IUD which became malpositioned over time.    Hx: The following portions of the patient's history were reviewed and updated as appropriate:             She  has a past medical history of Medical history non-contributory. She does not have any pertinent problems on file. She  has no past surgical history on file. Her family history includes Diabetes in her sister. She  reports that she has never smoked. She has never used smokeless tobacco. She reports that she does not drink alcohol and does not use drugs. She has a current medication list which includes the following prescription(s): etonogestrel-ethinyl estradiol. She is allergic to penicillins.       Review of Systems:  Review of Systems  Constitutional: Denied constitutional symptoms, night sweats, recent illness, fatigue, fever, insomnia and weight loss.  Eyes: Denied eye symptoms, eye pain, photophobia, vision change and visual disturbance.  Ears/Nose/Throat/Neck: Denied ear, nose, throat or neck symptoms, hearing loss, nasal discharge, sinus congestion and sore throat.  Cardiovascular: Denied cardiovascular symptoms, arrhythmia, chest pain/pressure, edema, exercise intolerance, orthopnea and palpitations.  Respiratory: Denied pulmonary symptoms, asthma, pleuritic pain, productive sputum, cough, dyspnea and wheezing.  Gastrointestinal: Denied, gastro-esophageal reflux, melena, nausea and vomiting.  Genitourinary: Denied genitourinary symptoms including symptomatic vaginal discharge, pelvic relaxation issues, and urinary complaints.  Musculoskeletal: Denied musculoskeletal symptoms, stiffness, swelling, muscle weakness and myalgia.  Dermatologic: Denied dermatology symptoms, rash and scar.   Neurologic: Denied neurology symptoms, dizziness, headache, neck pain and syncope.  Psychiatric: Denied psychiatric symptoms, anxiety and depression.  Endocrine: Denied endocrine symptoms including hot flashes and night sweats.   Meds:   Current Outpatient Medications on File Prior to Visit  Medication Sig Dispense Refill   etonogestrel-ethinyl estradiol (NUVARING) 0.12-0.015 MG/24HR vaginal ring Insert vaginally and leave in place for 3 consecutive weeks, then remove for 1 week. 1 each 11   No current facility-administered medications on file prior to visit.    Objective:     Vitals:   10/15/23 0938  BP: 132/86  Pulse: 74    Urinary pregnancy test negative    Physical examination   Pelvic:   Vulva: Normal appearance.  No lesions.  Vagina: No lesions or abnormalities noted.  Support: Normal pelvic support.  Urethra No masses tenderness or scarring.  Meatus Normal size without lesions or prolapse.  Cervix: Normal appearance.  No lesions.  Anus: Normal exam.  No lesions.  Perineum: Normal exam.  No lesions.        Bimanual   Uterus: Normal size.  Non-tender.  Mobile.  AV.  Adnexae: No masses.  Non-tender to palpation.  Cul-de-sac: Negative for abnormality.   IUD Procedure Pt has read the booklet and signed the appropriate forms regarding the Mirena IUD.  All of her questions have been answered.   The cervix was cleansed with betadine solution.  After sounding the uterus and noting the position, the IUD was placed in the usual manner without problem.  The string was cut to the appropriate length.  The patient tolerated the procedure well.            [facility] # = [id number]   Assessment:    G2P1001 Patient  Active Problem List   Diagnosis Date Noted   Gestational hypertension affecting second pregnancy 04/12/2022   Anemia of pregnancy in third trimester 04/12/2022   Gestational hypertension, third trimester 02/27/2022   Rh negative state in antepartum period  07/21/2015     1. Encounter for IUD insertion       Plan:             F/U  No follow-ups on file.  Elonda Husky, M.D. 10/15/2023 10:04 AM

## 2023-10-15 NOTE — Addendum Note (Signed)
Addended by: Georgiana Shore R on: 10/15/2023 10:40 AM   Modules accepted: Orders

## 2023-11-12 ENCOUNTER — Ambulatory Visit: Payer: 59 | Admitting: Obstetrics and Gynecology

## 2023-11-12 DIAGNOSIS — Z30431 Encounter for routine checking of intrauterine contraceptive device: Secondary | ICD-10-CM

## 2023-11-16 IMAGING — US US OB COMP +14 WK
1 series · 13 of 28 positions shown · non-contrast
Comparison: none

CLINICAL DATA: Second trimester pregnancy for fetal anatomy survey.

EXAM:
OBSTETRICAL ULTRASOUND >14 WKS

[Series 1: us ob comp + 14 wk · 13 of 71 slices shown]
[im 3/71]
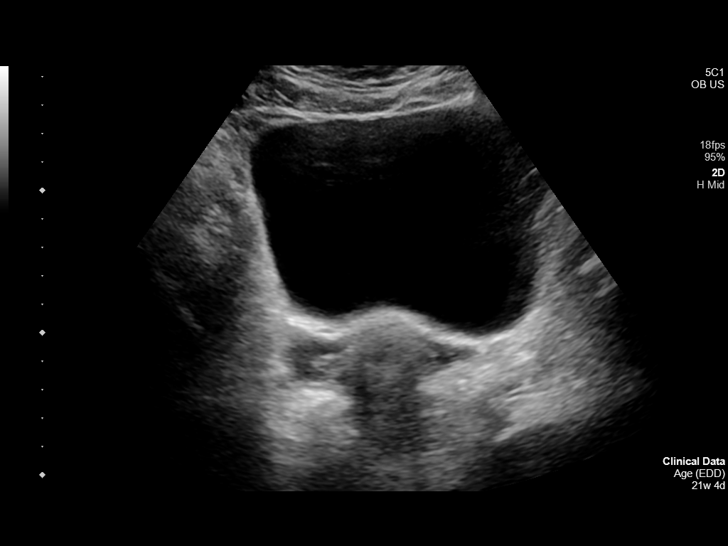
[im 8/71]
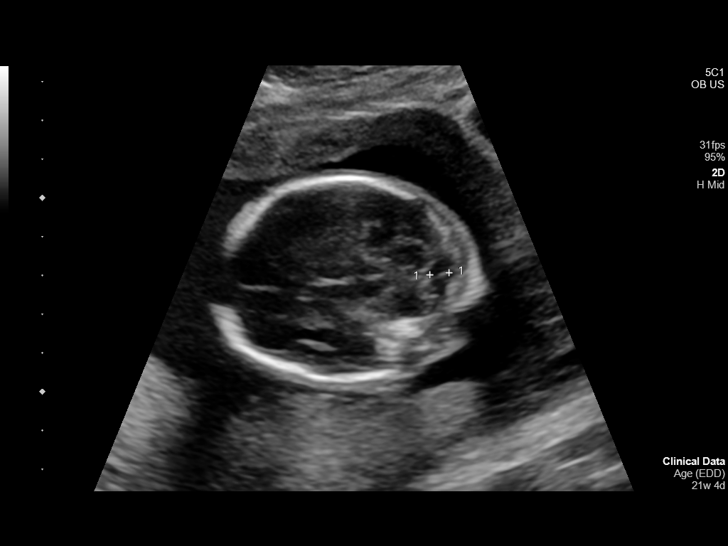
[im 13/71]
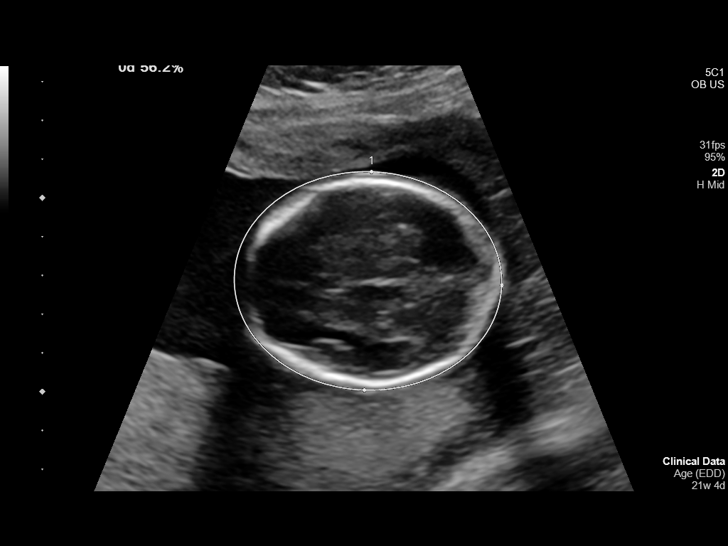
[im 19/71]
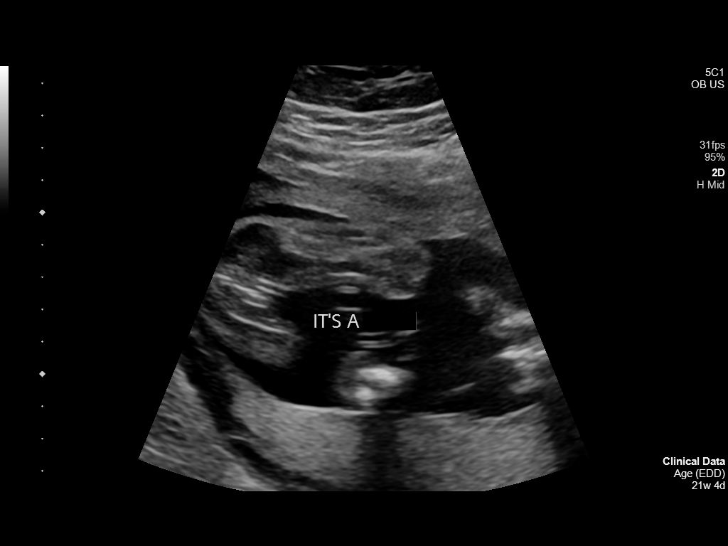
[im 24/71]
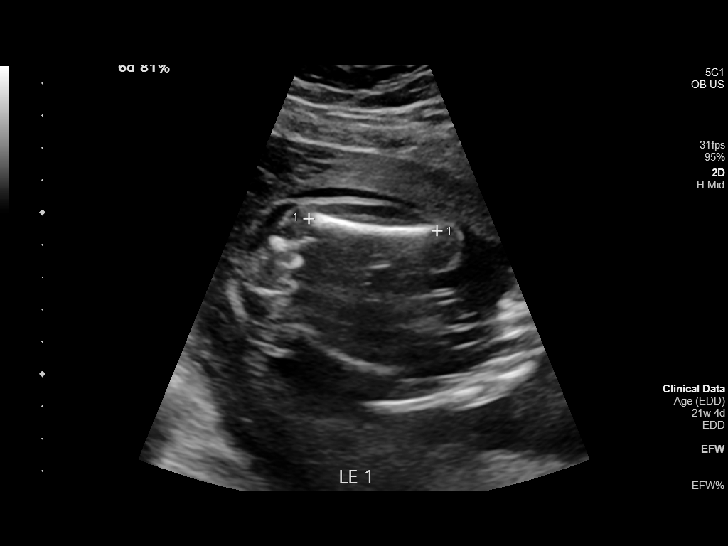
[im 29/71]
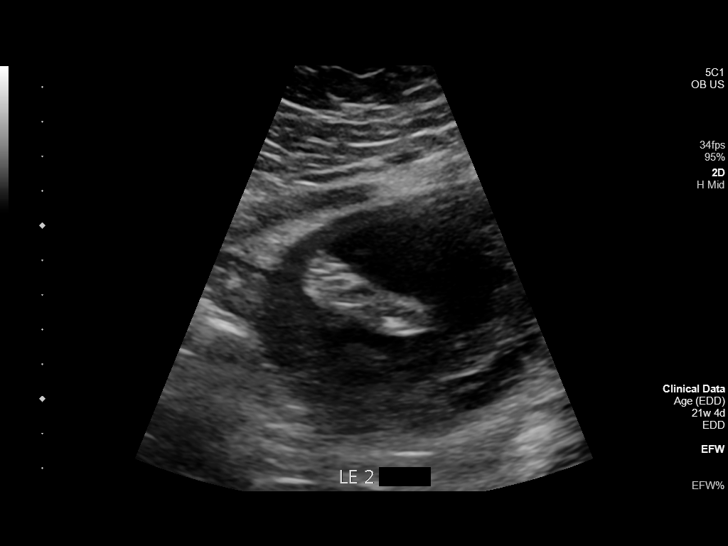
[im 37/71]
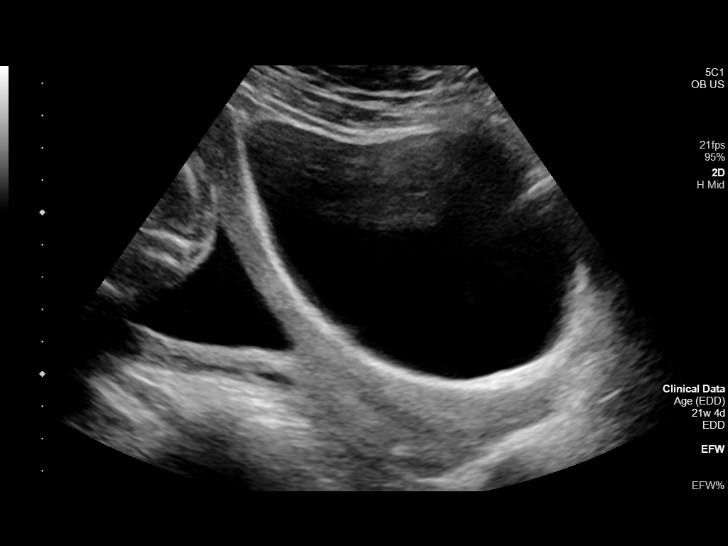
[im 42/71]
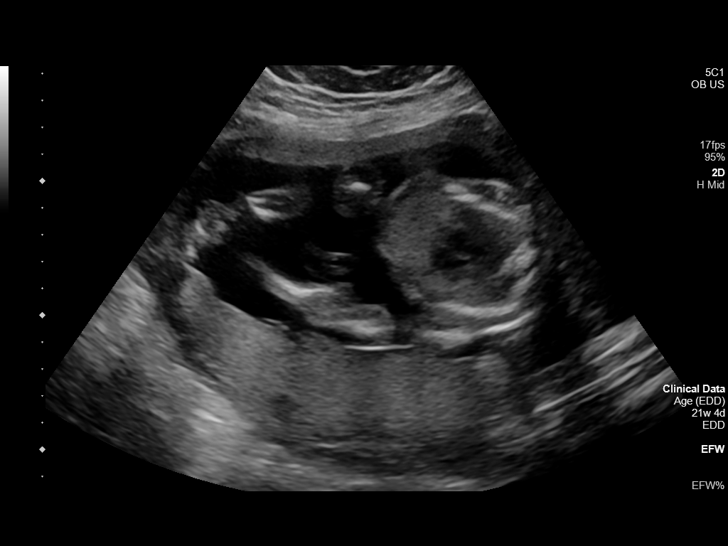
[im 47/71]
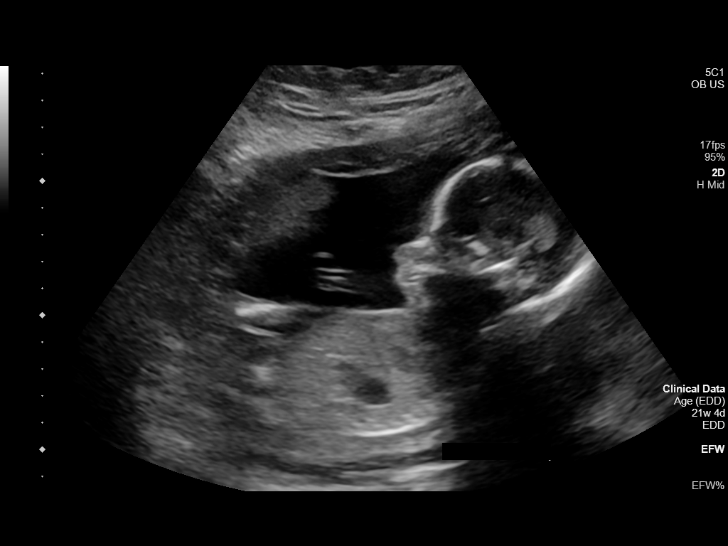
[im 52/71]
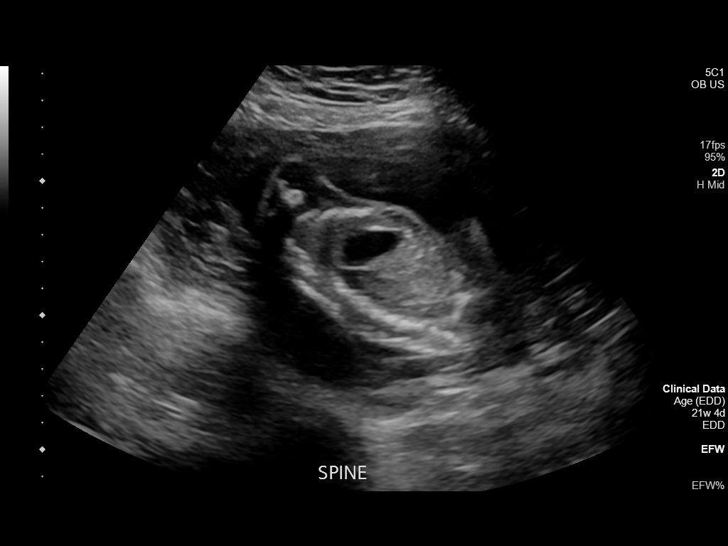
[im 58/71]
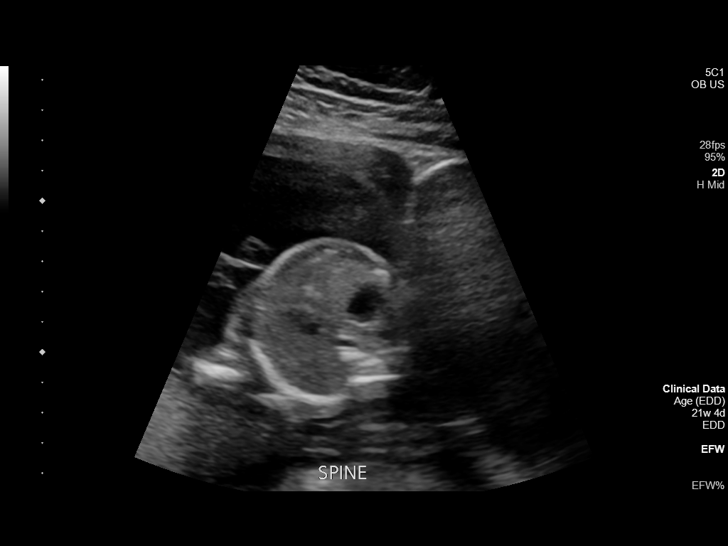
[im 63/71]
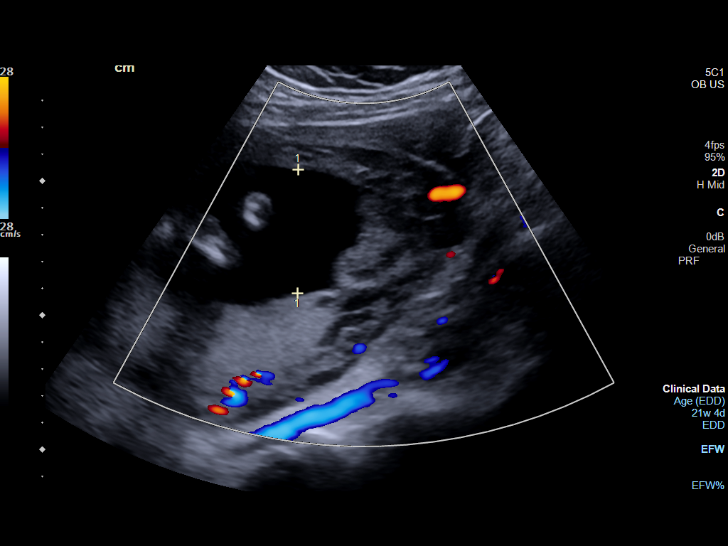
[im 68/71]
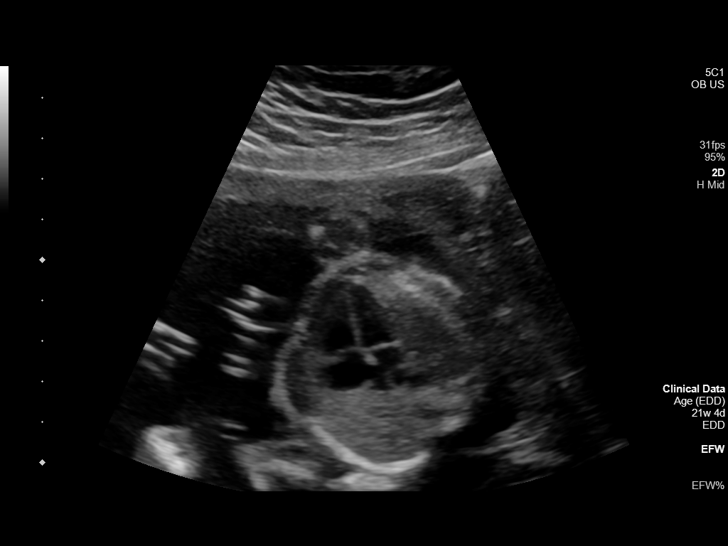

[13 of 28 positions shown; findings below may reference images not displayed]

FINDINGS: Number of Fetuses: 1

Heart Rate:  141 bpm

Movement: Yes

Presentation: Variable

Previa: No

Placental Location: Posterior

Amniotic Fluid (Subjective): Within normal limits

Amniotic Fluid (Objective):

Vertical pocket = 5.1cm

FETAL BIOMETRY

BPD: 5.4cm 22w 2d

HC:   19.8cm 22w 0d

AC:   18.4cm 23w 2d

FL:   4.0cm 22w 6d

Current Mean GA: 22w 4d US EDC: 04/13/2022

Assigned GA:  21w 4d Assigned EDC: 04/20/2022

FETAL ANATOMY

Lateral Ventricles: Appears normal

Thalami/CSP: Appears normal

Posterior Fossa:  Appears normal

Nuchal Region: Appears normal   NFT= N/A > 20 WKS

Upper Lip: Appears normal

Spine: Appears normal

4 Chamber Heart on Left: Appears normal

LVOT: Appears normal

RVOT: Appears normal

Stomach on Left: Appears normal

3 Vessel Cord: Appears normal

Cord Insertion site: Appears normal

Kidneys: Appears normal

Bladder: Appears normal

Extremities: Appears normal

Maternal Findings:

Cervix:  4.8 cm TA
IMPRESSION: Assigned GA currently 21 weeks 4 days.  Appropriate fetal growth.

Unremarkable fetal anatomic survey.  No fetal anomalies identified.

## 2023-11-18 ENCOUNTER — Ambulatory Visit: Payer: 59 | Admitting: Obstetrics and Gynecology

## 2023-11-18 DIAGNOSIS — Z30431 Encounter for routine checking of intrauterine contraceptive device: Secondary | ICD-10-CM

## 2023-11-26 ENCOUNTER — Ambulatory Visit: Payer: 59 | Admitting: Obstetrics and Gynecology

## 2023-11-26 DIAGNOSIS — Z30431 Encounter for routine checking of intrauterine contraceptive device: Secondary | ICD-10-CM

## 2023-11-28 ENCOUNTER — Encounter: Payer: Self-pay | Admitting: Obstetrics and Gynecology

## 2025-01-08 ENCOUNTER — Other Ambulatory Visit: Payer: Self-pay
